# Patient Record
Sex: Male | Born: 1935 | Race: White | Hispanic: No | Marital: Married | Smoking: Never smoker
Health system: Southern US, Community
[De-identification: ages and names within clinical notes are randomized; demographics above are authoritative.]

## PROBLEM LIST (undated history)

## (undated) DIAGNOSIS — J849 Interstitial pulmonary disease, unspecified: Secondary | ICD-10-CM

## (undated) DIAGNOSIS — R609 Edema, unspecified: Secondary | ICD-10-CM

## (undated) DIAGNOSIS — I6529 Occlusion and stenosis of unspecified carotid artery: Secondary | ICD-10-CM

## (undated) DIAGNOSIS — M19171 Post-traumatic osteoarthritis, right ankle and foot: Secondary | ICD-10-CM

## (undated) DIAGNOSIS — J84112 Idiopathic pulmonary fibrosis: Secondary | ICD-10-CM

## (undated) DIAGNOSIS — I10 Essential (primary) hypertension: Secondary | ICD-10-CM

## (undated) DIAGNOSIS — K219 Gastro-esophageal reflux disease without esophagitis: Secondary | ICD-10-CM

## (undated) DIAGNOSIS — H409 Unspecified glaucoma: Secondary | ICD-10-CM

## (undated) DIAGNOSIS — E785 Hyperlipidemia, unspecified: Secondary | ICD-10-CM

## (undated) DIAGNOSIS — R590 Localized enlarged lymph nodes: Secondary | ICD-10-CM

## (undated) DIAGNOSIS — J9610 Chronic respiratory failure, unspecified whether with hypoxia or hypercapnia: Secondary | ICD-10-CM

## (undated) HISTORY — PX: TONSILLECTOMY: SUR1361

## (undated) HISTORY — DX: Unspecified glaucoma: H40.9

## (undated) HISTORY — DX: Hyperlipidemia, unspecified: E78.5

## (undated) HISTORY — DX: Occlusion and stenosis of unspecified carotid artery: I65.29

## (undated) HISTORY — DX: Localized enlarged lymph nodes: R59.0

## (undated) HISTORY — DX: Essential (primary) hypertension: I10

## (undated) HISTORY — PX: OTHER SURGICAL HISTORY: SHX169

## (undated) HISTORY — PX: CHOLECYSTECTOMY: SHX55

## (undated) HISTORY — DX: Gastro-esophageal reflux disease without esophagitis: K21.9

## (undated) HISTORY — DX: Interstitial pulmonary disease, unspecified: J84.9

## (undated) HISTORY — DX: Edema, unspecified: R60.9

## (undated) HISTORY — DX: Post-traumatic osteoarthritis, right ankle and foot: M19.171

## (undated) HISTORY — PX: CAROTID ENDARTERECTOMY: SUR193

## (undated) HISTORY — DX: Chronic respiratory failure, unspecified whether with hypoxia or hypercapnia: J96.10

## (undated) HISTORY — DX: Idiopathic pulmonary fibrosis: J84.112

---

## 2011-05-29 DIAGNOSIS — H4011X0 Primary open-angle glaucoma, stage unspecified: Secondary | ICD-10-CM | POA: Diagnosis not present

## 2011-07-05 DIAGNOSIS — I10 Essential (primary) hypertension: Secondary | ICD-10-CM | POA: Diagnosis not present

## 2011-08-29 DIAGNOSIS — D500 Iron deficiency anemia secondary to blood loss (chronic): Secondary | ICD-10-CM | POA: Diagnosis not present

## 2011-09-10 DIAGNOSIS — Z79899 Other long term (current) drug therapy: Secondary | ICD-10-CM | POA: Diagnosis not present

## 2011-09-10 DIAGNOSIS — Z125 Encounter for screening for malignant neoplasm of prostate: Secondary | ICD-10-CM | POA: Diagnosis not present

## 2011-09-10 DIAGNOSIS — M25519 Pain in unspecified shoulder: Secondary | ICD-10-CM | POA: Diagnosis not present

## 2011-09-10 DIAGNOSIS — I440 Atrioventricular block, first degree: Secondary | ICD-10-CM | POA: Diagnosis not present

## 2011-09-10 DIAGNOSIS — E559 Vitamin D deficiency, unspecified: Secondary | ICD-10-CM | POA: Diagnosis not present

## 2011-09-10 DIAGNOSIS — E782 Mixed hyperlipidemia: Secondary | ICD-10-CM | POA: Diagnosis not present

## 2011-09-18 DIAGNOSIS — M19171 Post-traumatic osteoarthritis, right ankle and foot: Secondary | ICD-10-CM

## 2011-09-18 DIAGNOSIS — M25579 Pain in unspecified ankle and joints of unspecified foot: Secondary | ICD-10-CM | POA: Diagnosis not present

## 2011-09-18 HISTORY — DX: Post-traumatic osteoarthritis, right ankle and foot: M19.171

## 2011-09-26 DIAGNOSIS — H4011X0 Primary open-angle glaucoma, stage unspecified: Secondary | ICD-10-CM | POA: Diagnosis not present

## 2011-10-03 DIAGNOSIS — I440 Atrioventricular block, first degree: Secondary | ICD-10-CM | POA: Diagnosis not present

## 2011-10-08 DIAGNOSIS — M810 Age-related osteoporosis without current pathological fracture: Secondary | ICD-10-CM | POA: Diagnosis not present

## 2011-10-18 DIAGNOSIS — M25519 Pain in unspecified shoulder: Secondary | ICD-10-CM | POA: Diagnosis not present

## 2011-11-21 DIAGNOSIS — M25519 Pain in unspecified shoulder: Secondary | ICD-10-CM | POA: Diagnosis not present

## 2011-11-21 DIAGNOSIS — M719 Bursopathy, unspecified: Secondary | ICD-10-CM | POA: Diagnosis not present

## 2011-11-23 DIAGNOSIS — K2270 Barrett's esophagus without dysplasia: Secondary | ICD-10-CM | POA: Diagnosis not present

## 2011-11-23 DIAGNOSIS — D500 Iron deficiency anemia secondary to blood loss (chronic): Secondary | ICD-10-CM | POA: Diagnosis not present

## 2011-12-04 DIAGNOSIS — H6120 Impacted cerumen, unspecified ear: Secondary | ICD-10-CM | POA: Diagnosis not present

## 2011-12-10 DIAGNOSIS — Z79899 Other long term (current) drug therapy: Secondary | ICD-10-CM | POA: Diagnosis not present

## 2011-12-10 DIAGNOSIS — D539 Nutritional anemia, unspecified: Secondary | ICD-10-CM | POA: Diagnosis not present

## 2011-12-10 DIAGNOSIS — R634 Abnormal weight loss: Secondary | ICD-10-CM | POA: Diagnosis not present

## 2011-12-10 DIAGNOSIS — E559 Vitamin D deficiency, unspecified: Secondary | ICD-10-CM | POA: Diagnosis not present

## 2011-12-19 DIAGNOSIS — R634 Abnormal weight loss: Secondary | ICD-10-CM | POA: Diagnosis not present

## 2012-01-31 DIAGNOSIS — Z23 Encounter for immunization: Secondary | ICD-10-CM | POA: Diagnosis not present

## 2012-02-04 DIAGNOSIS — H4011X0 Primary open-angle glaucoma, stage unspecified: Secondary | ICD-10-CM | POA: Diagnosis not present

## 2012-06-09 DIAGNOSIS — H4011X0 Primary open-angle glaucoma, stage unspecified: Secondary | ICD-10-CM | POA: Diagnosis not present

## 2012-06-21 DIAGNOSIS — I10 Essential (primary) hypertension: Secondary | ICD-10-CM | POA: Diagnosis not present

## 2012-06-27 DIAGNOSIS — I10 Essential (primary) hypertension: Secondary | ICD-10-CM | POA: Diagnosis not present

## 2012-07-01 DIAGNOSIS — Z471 Aftercare following joint replacement surgery: Secondary | ICD-10-CM | POA: Diagnosis not present

## 2012-07-01 DIAGNOSIS — M12579 Traumatic arthropathy, unspecified ankle and foot: Secondary | ICD-10-CM | POA: Diagnosis not present

## 2012-07-01 DIAGNOSIS — Z96669 Presence of unspecified artificial ankle joint: Secondary | ICD-10-CM | POA: Diagnosis not present

## 2012-07-15 DIAGNOSIS — E559 Vitamin D deficiency, unspecified: Secondary | ICD-10-CM | POA: Diagnosis not present

## 2012-07-15 DIAGNOSIS — E782 Mixed hyperlipidemia: Secondary | ICD-10-CM | POA: Diagnosis not present

## 2012-09-10 DIAGNOSIS — Z0000 Encounter for general adult medical examination without abnormal findings: Secondary | ICD-10-CM | POA: Diagnosis not present

## 2012-09-10 DIAGNOSIS — E782 Mixed hyperlipidemia: Secondary | ICD-10-CM | POA: Diagnosis not present

## 2012-09-10 DIAGNOSIS — Z79899 Other long term (current) drug therapy: Secondary | ICD-10-CM | POA: Diagnosis not present

## 2012-09-10 DIAGNOSIS — Z125 Encounter for screening for malignant neoplasm of prostate: Secondary | ICD-10-CM | POA: Diagnosis not present

## 2012-09-10 DIAGNOSIS — I440 Atrioventricular block, first degree: Secondary | ICD-10-CM | POA: Diagnosis not present

## 2012-09-10 DIAGNOSIS — I10 Essential (primary) hypertension: Secondary | ICD-10-CM | POA: Diagnosis not present

## 2012-09-10 DIAGNOSIS — M899 Disorder of bone, unspecified: Secondary | ICD-10-CM | POA: Diagnosis not present

## 2012-09-16 DIAGNOSIS — I440 Atrioventricular block, first degree: Secondary | ICD-10-CM | POA: Diagnosis not present

## 2012-09-17 DIAGNOSIS — Z1211 Encounter for screening for malignant neoplasm of colon: Secondary | ICD-10-CM | POA: Diagnosis not present

## 2012-10-20 DIAGNOSIS — Z961 Presence of intraocular lens: Secondary | ICD-10-CM | POA: Diagnosis not present

## 2012-10-20 DIAGNOSIS — Z9849 Cataract extraction status, unspecified eye: Secondary | ICD-10-CM | POA: Diagnosis not present

## 2012-10-20 DIAGNOSIS — H5200 Hypermetropia, unspecified eye: Secondary | ICD-10-CM | POA: Diagnosis not present

## 2012-10-20 DIAGNOSIS — H4011X0 Primary open-angle glaucoma, stage unspecified: Secondary | ICD-10-CM | POA: Diagnosis not present

## 2012-11-11 DIAGNOSIS — Z86010 Personal history of colonic polyps: Secondary | ICD-10-CM | POA: Diagnosis not present

## 2012-11-11 DIAGNOSIS — K2270 Barrett's esophagus without dysplasia: Secondary | ICD-10-CM | POA: Diagnosis not present

## 2012-11-12 DIAGNOSIS — H40159 Residual stage of open-angle glaucoma, unspecified eye: Secondary | ICD-10-CM | POA: Diagnosis not present

## 2012-12-09 DIAGNOSIS — K2270 Barrett's esophagus without dysplasia: Secondary | ICD-10-CM | POA: Diagnosis not present

## 2012-12-09 DIAGNOSIS — K449 Diaphragmatic hernia without obstruction or gangrene: Secondary | ICD-10-CM | POA: Diagnosis not present

## 2012-12-09 DIAGNOSIS — K2940 Chronic atrophic gastritis without bleeding: Secondary | ICD-10-CM | POA: Diagnosis not present

## 2012-12-09 DIAGNOSIS — K5730 Diverticulosis of large intestine without perforation or abscess without bleeding: Secondary | ICD-10-CM | POA: Diagnosis not present

## 2012-12-09 DIAGNOSIS — D126 Benign neoplasm of colon, unspecified: Secondary | ICD-10-CM | POA: Diagnosis not present

## 2012-12-09 DIAGNOSIS — Z86010 Personal history of colonic polyps: Secondary | ICD-10-CM | POA: Diagnosis not present

## 2013-01-30 DIAGNOSIS — Z23 Encounter for immunization: Secondary | ICD-10-CM | POA: Diagnosis not present

## 2013-02-02 DIAGNOSIS — H40159 Residual stage of open-angle glaucoma, unspecified eye: Secondary | ICD-10-CM | POA: Diagnosis not present

## 2013-03-09 DIAGNOSIS — D649 Anemia, unspecified: Secondary | ICD-10-CM | POA: Diagnosis not present

## 2013-03-30 DIAGNOSIS — J370 Chronic laryngitis: Secondary | ICD-10-CM | POA: Diagnosis not present

## 2013-03-30 DIAGNOSIS — R498 Other voice and resonance disorders: Secondary | ICD-10-CM | POA: Diagnosis not present

## 2013-06-18 DIAGNOSIS — I10 Essential (primary) hypertension: Secondary | ICD-10-CM | POA: Diagnosis not present

## 2013-06-29 DIAGNOSIS — I10 Essential (primary) hypertension: Secondary | ICD-10-CM | POA: Diagnosis not present

## 2013-06-29 DIAGNOSIS — D509 Iron deficiency anemia, unspecified: Secondary | ICD-10-CM | POA: Diagnosis not present

## 2013-07-30 DIAGNOSIS — M25579 Pain in unspecified ankle and joints of unspecified foot: Secondary | ICD-10-CM | POA: Diagnosis not present

## 2013-07-30 DIAGNOSIS — M12579 Traumatic arthropathy, unspecified ankle and foot: Secondary | ICD-10-CM | POA: Diagnosis not present

## 2013-08-28 DIAGNOSIS — H40159 Residual stage of open-angle glaucoma, unspecified eye: Secondary | ICD-10-CM | POA: Diagnosis not present

## 2013-09-11 DIAGNOSIS — E782 Mixed hyperlipidemia: Secondary | ICD-10-CM | POA: Diagnosis not present

## 2013-09-11 DIAGNOSIS — D509 Iron deficiency anemia, unspecified: Secondary | ICD-10-CM | POA: Diagnosis not present

## 2013-09-11 DIAGNOSIS — Z79899 Other long term (current) drug therapy: Secondary | ICD-10-CM | POA: Diagnosis not present

## 2013-09-11 DIAGNOSIS — Z1211 Encounter for screening for malignant neoplasm of colon: Secondary | ICD-10-CM | POA: Diagnosis not present

## 2013-09-11 DIAGNOSIS — Z0000 Encounter for general adult medical examination without abnormal findings: Secondary | ICD-10-CM | POA: Diagnosis not present

## 2013-09-11 DIAGNOSIS — I440 Atrioventricular block, first degree: Secondary | ICD-10-CM | POA: Diagnosis not present

## 2013-09-11 DIAGNOSIS — Z125 Encounter for screening for malignant neoplasm of prostate: Secondary | ICD-10-CM | POA: Diagnosis not present

## 2013-09-11 DIAGNOSIS — I10 Essential (primary) hypertension: Secondary | ICD-10-CM | POA: Diagnosis not present

## 2013-09-15 DIAGNOSIS — Z1211 Encounter for screening for malignant neoplasm of colon: Secondary | ICD-10-CM | POA: Diagnosis not present

## 2013-09-22 DIAGNOSIS — H40159 Residual stage of open-angle glaucoma, unspecified eye: Secondary | ICD-10-CM | POA: Diagnosis not present

## 2013-10-01 DIAGNOSIS — M810 Age-related osteoporosis without current pathological fracture: Secondary | ICD-10-CM | POA: Diagnosis not present

## 2013-11-12 DIAGNOSIS — L84 Corns and callosities: Secondary | ICD-10-CM | POA: Diagnosis not present

## 2013-11-12 DIAGNOSIS — M7750 Other enthesopathy of unspecified foot: Secondary | ICD-10-CM | POA: Diagnosis not present

## 2013-12-23 DIAGNOSIS — K2270 Barrett's esophagus without dysplasia: Secondary | ICD-10-CM | POA: Diagnosis not present

## 2013-12-25 DIAGNOSIS — H40159 Residual stage of open-angle glaucoma, unspecified eye: Secondary | ICD-10-CM | POA: Diagnosis not present

## 2014-02-09 DIAGNOSIS — Z23 Encounter for immunization: Secondary | ICD-10-CM | POA: Diagnosis not present

## 2014-02-24 DIAGNOSIS — Z23 Encounter for immunization: Secondary | ICD-10-CM | POA: Diagnosis not present

## 2014-04-26 DIAGNOSIS — H40153 Residual stage of open-angle glaucoma, bilateral: Secondary | ICD-10-CM | POA: Diagnosis not present

## 2014-06-07 DIAGNOSIS — H40153 Residual stage of open-angle glaucoma, bilateral: Secondary | ICD-10-CM | POA: Diagnosis not present

## 2014-07-16 DIAGNOSIS — H40153 Residual stage of open-angle glaucoma, bilateral: Secondary | ICD-10-CM | POA: Diagnosis not present

## 2014-08-03 DIAGNOSIS — M19171 Post-traumatic osteoarthritis, right ankle and foot: Secondary | ICD-10-CM | POA: Diagnosis not present

## 2014-08-03 DIAGNOSIS — Z96661 Presence of right artificial ankle joint: Secondary | ICD-10-CM | POA: Diagnosis not present

## 2014-08-03 DIAGNOSIS — M25571 Pain in right ankle and joints of right foot: Secondary | ICD-10-CM | POA: Diagnosis not present

## 2014-09-03 DIAGNOSIS — E611 Iron deficiency: Secondary | ICD-10-CM | POA: Diagnosis not present

## 2014-09-03 DIAGNOSIS — D509 Iron deficiency anemia, unspecified: Secondary | ICD-10-CM | POA: Diagnosis not present

## 2014-09-03 DIAGNOSIS — D649 Anemia, unspecified: Secondary | ICD-10-CM | POA: Diagnosis not present

## 2014-09-06 DIAGNOSIS — H40153 Residual stage of open-angle glaucoma, bilateral: Secondary | ICD-10-CM | POA: Diagnosis not present

## 2014-09-20 DIAGNOSIS — Z760 Encounter for issue of repeat prescription: Secondary | ICD-10-CM | POA: Diagnosis not present

## 2014-09-20 DIAGNOSIS — Z0000 Encounter for general adult medical examination without abnormal findings: Secondary | ICD-10-CM | POA: Diagnosis not present

## 2014-09-20 DIAGNOSIS — E782 Mixed hyperlipidemia: Secondary | ICD-10-CM | POA: Diagnosis not present

## 2014-09-20 DIAGNOSIS — Z79899 Other long term (current) drug therapy: Secondary | ICD-10-CM | POA: Diagnosis not present

## 2014-09-20 DIAGNOSIS — M8580 Other specified disorders of bone density and structure, unspecified site: Secondary | ICD-10-CM | POA: Diagnosis not present

## 2014-10-18 DIAGNOSIS — H40153 Residual stage of open-angle glaucoma, bilateral: Secondary | ICD-10-CM | POA: Diagnosis not present

## 2015-01-19 DIAGNOSIS — K2270 Barrett's esophagus without dysplasia: Secondary | ICD-10-CM | POA: Diagnosis not present

## 2015-01-27 DIAGNOSIS — Z23 Encounter for immunization: Secondary | ICD-10-CM | POA: Diagnosis not present

## 2015-02-10 DIAGNOSIS — L918 Other hypertrophic disorders of the skin: Secondary | ICD-10-CM | POA: Diagnosis not present

## 2015-02-10 DIAGNOSIS — L578 Other skin changes due to chronic exposure to nonionizing radiation: Secondary | ICD-10-CM | POA: Diagnosis not present

## 2015-02-10 DIAGNOSIS — C44329 Squamous cell carcinoma of skin of other parts of face: Secondary | ICD-10-CM | POA: Diagnosis not present

## 2015-02-10 DIAGNOSIS — L570 Actinic keratosis: Secondary | ICD-10-CM | POA: Diagnosis not present

## 2015-02-18 DIAGNOSIS — H40153 Residual stage of open-angle glaucoma, bilateral: Secondary | ICD-10-CM | POA: Diagnosis not present

## 2015-04-22 DIAGNOSIS — H401133 Primary open-angle glaucoma, bilateral, severe stage: Secondary | ICD-10-CM | POA: Diagnosis not present

## 2015-05-18 DIAGNOSIS — J208 Acute bronchitis due to other specified organisms: Secondary | ICD-10-CM | POA: Diagnosis not present

## 2015-08-15 DIAGNOSIS — R634 Abnormal weight loss: Secondary | ICD-10-CM | POA: Diagnosis not present

## 2015-08-15 DIAGNOSIS — R05 Cough: Secondary | ICD-10-CM | POA: Diagnosis not present

## 2015-08-19 DIAGNOSIS — R634 Abnormal weight loss: Secondary | ICD-10-CM | POA: Diagnosis not present

## 2015-08-19 DIAGNOSIS — R590 Localized enlarged lymph nodes: Secondary | ICD-10-CM | POA: Diagnosis not present

## 2015-08-19 DIAGNOSIS — J849 Interstitial pulmonary disease, unspecified: Secondary | ICD-10-CM

## 2015-08-19 DIAGNOSIS — J8410 Pulmonary fibrosis, unspecified: Secondary | ICD-10-CM | POA: Diagnosis not present

## 2015-08-19 HISTORY — DX: Interstitial pulmonary disease, unspecified: J84.9

## 2015-08-22 DIAGNOSIS — H401133 Primary open-angle glaucoma, bilateral, severe stage: Secondary | ICD-10-CM | POA: Diagnosis not present

## 2015-08-24 DIAGNOSIS — H47233 Glaucomatous optic atrophy, bilateral: Secondary | ICD-10-CM | POA: Diagnosis not present

## 2015-08-24 DIAGNOSIS — H52223 Regular astigmatism, bilateral: Secondary | ICD-10-CM | POA: Diagnosis not present

## 2015-08-24 DIAGNOSIS — H524 Presbyopia: Secondary | ICD-10-CM | POA: Diagnosis not present

## 2015-08-24 DIAGNOSIS — H5203 Hypermetropia, bilateral: Secondary | ICD-10-CM | POA: Diagnosis not present

## 2015-08-24 DIAGNOSIS — Z961 Presence of intraocular lens: Secondary | ICD-10-CM | POA: Diagnosis not present

## 2015-08-24 DIAGNOSIS — H401133 Primary open-angle glaucoma, bilateral, severe stage: Secondary | ICD-10-CM | POA: Diagnosis not present

## 2015-08-24 DIAGNOSIS — I10 Essential (primary) hypertension: Secondary | ICD-10-CM | POA: Diagnosis not present

## 2015-08-24 DIAGNOSIS — H353131 Nonexudative age-related macular degeneration, bilateral, early dry stage: Secondary | ICD-10-CM | POA: Diagnosis not present

## 2015-08-24 DIAGNOSIS — H3530 Unspecified macular degeneration: Secondary | ICD-10-CM | POA: Diagnosis not present

## 2015-09-22 ENCOUNTER — Ambulatory Visit (INDEPENDENT_AMBULATORY_CARE_PROVIDER_SITE_OTHER): Payer: Medicare Other | Admitting: Pulmonary Disease

## 2015-09-22 ENCOUNTER — Encounter: Payer: Self-pay | Admitting: Pulmonary Disease

## 2015-09-22 ENCOUNTER — Other Ambulatory Visit (INDEPENDENT_AMBULATORY_CARE_PROVIDER_SITE_OTHER): Payer: Medicare Other

## 2015-09-22 VITALS — BP 160/86 | HR 57 | Ht 69.0 in | Wt 156.6 lb

## 2015-09-22 DIAGNOSIS — K219 Gastro-esophageal reflux disease without esophagitis: Secondary | ICD-10-CM | POA: Diagnosis not present

## 2015-09-22 DIAGNOSIS — R600 Localized edema: Secondary | ICD-10-CM | POA: Diagnosis not present

## 2015-09-22 DIAGNOSIS — I10 Essential (primary) hypertension: Secondary | ICD-10-CM

## 2015-09-22 DIAGNOSIS — R609 Edema, unspecified: Secondary | ICD-10-CM

## 2015-09-22 DIAGNOSIS — R599 Enlarged lymph nodes, unspecified: Secondary | ICD-10-CM | POA: Diagnosis not present

## 2015-09-22 DIAGNOSIS — R590 Localized enlarged lymph nodes: Secondary | ICD-10-CM

## 2015-09-22 DIAGNOSIS — H409 Unspecified glaucoma: Secondary | ICD-10-CM | POA: Insufficient documentation

## 2015-09-22 DIAGNOSIS — R0602 Shortness of breath: Secondary | ICD-10-CM | POA: Diagnosis not present

## 2015-09-22 DIAGNOSIS — J849 Interstitial pulmonary disease, unspecified: Secondary | ICD-10-CM | POA: Diagnosis not present

## 2015-09-22 DIAGNOSIS — E785 Hyperlipidemia, unspecified: Secondary | ICD-10-CM | POA: Insufficient documentation

## 2015-09-22 HISTORY — DX: Essential (primary) hypertension: I10

## 2015-09-22 HISTORY — DX: Edema, unspecified: R60.9

## 2015-09-22 HISTORY — DX: Localized enlarged lymph nodes: R59.0

## 2015-09-22 LAB — C-REACTIVE PROTEIN: CRP: 0.5 mg/dL (ref 0.5–20.0)

## 2015-09-22 LAB — SEDIMENTATION RATE: Sed Rate: 16 mm/hr (ref 0–22)

## 2015-09-22 LAB — BRAIN NATRIURETIC PEPTIDE: PRO B NATRI PEPTIDE: 97 pg/mL (ref 0.0–100.0)

## 2015-09-22 NOTE — Progress Notes (Signed)
Subjective:    Patient ID: Casey [name], male    DOB: Jun 02, 1928, 82 y.o.   MRN: [id number]  HPI He reports his entire family rented a house in [facility] the week after Christmas 2016. He reports that during that trip 6 separate individuals developed a cough and "cold". He reports that after he got home he had a week of coughing. His daughter-in-law reports she had sinus congestion & drainage prior to developing a cough productive of a green mucus. She denies any fever, chills, or sweats at that time. She did have a sore throat at the time. She was seen by a physician and was treated with antibiotics without a steroid & improvement in symptoms. The house was dry without any musty smell. There was a pool that was heated but still cool. The pool water appeared clean. He reports he was back home for approximately 3 days before he developed a cough that was nonproductive as well as sinus congestion. He denies any associated fever, chills, or sweats. He reports he didn't have any antibiotic therapy. The cough continued to linger and prompted his evaluation. He reports he had lost about 7-9 pounds in 2-3 months. He denies any associated chest tightness or pressure. Denies any dyspnea. Denies any rashes or abnormal bruising. Denies any prior exposure to chemotherapy agents, radiation, or antiarrhythmics. He reports his cough has mostly resolved.    Review of Systems No joint swelling, erythema, or stiffness. No dry eyes or dry mouth. No reflux, dyspepsia, morning brash water taste, dysphagia or odynophagia. A pertinent 14 point review of systems is negative except as per the history of presenting illness.  No Known Allergies  No current outpatient prescriptions on file prior to visit.   No current facility-administered medications on file prior to visit.    Past Medical History  Diagnosis Date  . Hypertension   . Hyperlipemia   . GERD (gastroesophageal reflux disease)   . Glaucoma   . Carotid artery  stenosis     Past Surgical History  Procedure Laterality Date  . Tonsillectomy    . Hernia surgery    . Right ankle replacement    . Right leg surgery      x 2  . Cholecystectomy    . Carotid endarterectomy      Family History  Problem Relation Age of Onset  . Heart disease Father   . Heart disease Brother   . Diabetes Brother   . Prostate cancer Brother   . Heart disease Sister   . Diabetes Sister   . Lung disease Neg Hx   . Rheumatologic disease Neg Hx   . Heart disease Other     Social History   Social History  . Marital Status: Married    Spouse Name: N/A  . Number of Children: N/A  . Years of Education: N/A   Occupational History  . Retired     Customer service manager   Social History Main Topics  . Smoking status: Never Smoker   . Smokeless tobacco: Never Used  . Alcohol Use: 0.0 oz/week    0 Standard drinks or equivalent per week     Comment: social  . Drug Use: No  . Sexual Activity: Not Asked   Other Topics Concern  . None   Social History Narrative   Originally from [place]. Has lived in [place], [place], [place], [place]. Remote travel to [place], [place], & [place]. Previously worked Education administrator. Currently has a dog. No bird, mold, asbestos,  or hot tub exposure. Patient does have a pool. Enjoys doing yard work. Remote exposure to silage and hay. Also worked with cattle.       Objective:   Physical Exam BP 160/86 mmHg  Pulse 57  Ht 5' 9"  (1.753 m)  Wt 156 lb 9.6 oz (71.033 kg)  BMI 23.12 kg/m2  SpO2 95% General:  Awake. Alert. No acute distress. Thin, elderly male.  Integument:  Warm & dry. No rash on exposed skin.  Lymphatics:  No appreciated cervical or supraclavicular lymphadenoapthy. HEENT:  Moist mucus membranes. No oral ulcers. No scleral injection or icterus. Bilateral nasal turbinate swelling right greater than left. Cardiovascular:  Regular rate. Bilateral lower extremity edema right greater than left. No appreciable JVD.  Pulmonary:  Good aeration  bilaterally. Bilateral diffuse crackles on auscultation. Symmetric chest wall expansion. No accessory muscle use on room air. Abdomen: Soft. Normal bowel sounds. Nondistended. Grossly nontender. Musculoskeletal:  Normal bulk and tone. Hand grip strength 5/5 bilaterally. No joint deformity or effusion appreciated. Neurological:  CN 2-12 grossly in tact. No meningismus. Moving all 4 extremities equally. Symmetric brachioradialis deep tendon reflexes. Psychiatric:  Mood and affect congruent. Speech normal rhythm, rate & tone.   IMAGING CT CHEST/ABD/PELVIS W/ 08/19/15 (personally reviewed by me): Mediastinal lymphadenopathy noted with the largest lymph node measuring 1.6 cm at level 7, 1.3 cm 4L, & 1.1 cm 4R positions. Calcifications noted within the spleen and liver suggestive of prior granulomatous disease. No pleural effusion or thickening. Patchy bilateral intralobular septal thickening and reticulation consistent with interstitial lung disease. This appears both centrally in the lungs as well as peripherally occurring throughout all fields with slightly less involvement in the upper lung zones. Suggestion of traction bronchiectasis. No obvious groundglass opacities. There is also suggestion of honeycomb changes. Remote T-8 compression fracture with advanced anterior height loss. Mild finding enlargement of left inguinal canal. Renal cysts on the left noted with mild renal atrophy and no hydronephrosis. Enlarged central prostate that is symmetric. Colonic diverticulosis noted. No appendicitis. No mass or adenopathy. No ascites or pneumoperitoneum.  PORT CXR 02/01/09 (personally reviewed by me): Heart appears to be largely normal in size and unchanged when compared with previous portable chest x-ray from 2009. Mediastinum normal in contour. No obvious parenchymal opacities or increased interstitial markings. No pleural effusion.  LABS 08/15/15 CBC: 9.1/14.2/43.2/239 BMP: 1:30/3.9/105/24/20/1.43/98/9.0 LFT:  3.8/6.7/0.4/73/19/20 ESR: 11 CRP: 5.5    Assessment & Plan:  82 year old male with an acute illness after travel to [place] vacation home in December 2016. Given the timing of patient's symptoms and other family members were also sick I am highly suspicious that he had an exposure possibly to a mild/fungus that could have precipitated acute hypersensitivity pneumonitis and what I am seeing on his chest CT scan at this time is simply the fibrotic changes from this acute illness as his symptoms have largely resolved at this time. Certainly these fibrotic changes could've been present for some time previously and I did communicate this to both the patient and his daughter-in-law who was present; however, there is no chest imaging that would provide a correlate for duration on my review. He lacks any family history of autoimmune disease or personal symptoms that would suggest an autoimmune process as a potential cause for the interstitial lung disease seen. We did discuss the mediastinal lymphadenopathy with pathologically enlarged right, but he has no parenchymal opacities or nodules that would suggest lung cancer. Certainly lymphoma is a possibility, but he only reports  weight loss and no other symptoms that would suggest an underlying malignancy. We will need to better address his lung function and further trend his lung function with time. Given the absence of symptoms and obvious inflammation on CT imaging I do not feel that immunosuppression is warranted or necessary at this time. I instructed the patient to contact my office if he had any new breathing problems or questions before his next appointment.  1. ILD: Checking high-resolution CT chest without contrast. Checking full pulmonary function testing as well as 6 minute walk test on room air at follow-up appointment. Ordering ESR, CRP, ANA with reflex to comprehensive panel, & hypersensitivity pneumonitis panel. Deferring surgical lung biopsy &  immunosuppression at this time. 2. Mediastinal lymphadenopathy: Plan to repeat CT chest without contrast after next appointment. 3. GERD: Asymptomatic on Protonix. No changes at this time. 4. Edema: Checking serum BNP. Consider transthoracic echocardiogram based on this result. 5. Follow-up: Patient to return to clinic in 3-4 weeks.  Sonia Baller Ashok Cordia, M.D. [facility] Pulmonary & Critical Care Pager:  [phone] After 3pm or if no response, call [phone] 12:34 PM 09/22/2015

## 2015-09-22 NOTE — Patient Instructions (Signed)
   Call me if you have any new breathing problems or return of your cough  We will review your blood work and chest CT scan at your next appointment  You'll have a breathing and walking test at your next appointment  I will see you back in 3-4 weeks  TESTS ORDERED: 1. Lab work today 2. High-resolution CT chest without contrast at [facility] 3. Full pulmonary function testing at follow-up appointment 4. 6 minute walk test on room air at follow-up appointment

## 2015-09-26 LAB — HYPERSENSITIVITY PNEUMONITIS
A. FUMIGATUS #1 ABS: NEGATIVE
A. Pullulans Abs: NEGATIVE
MICROPOLYSPORA FAENI IGG: NEGATIVE
PIGEON SERUM ABS: NEGATIVE
THERMOACTINOMYCES VULGARIS IGG: NEGATIVE
Thermoact. Saccharii: NEGATIVE

## 2015-09-26 LAB — ANA W/REFLEX IF POSITIVE: Anti Nuclear Antibody(ANA): NEGATIVE

## 2015-09-27 DIAGNOSIS — J849 Interstitial pulmonary disease, unspecified: Secondary | ICD-10-CM | POA: Diagnosis not present

## 2015-09-27 DIAGNOSIS — R590 Localized enlarged lymph nodes: Secondary | ICD-10-CM | POA: Diagnosis not present

## 2015-09-27 DIAGNOSIS — J984 Other disorders of lung: Secondary | ICD-10-CM | POA: Diagnosis not present

## 2015-10-13 ENCOUNTER — Encounter: Payer: Self-pay | Admitting: Pulmonary Disease

## 2015-10-20 ENCOUNTER — Ambulatory Visit: Payer: Medicare Other | Admitting: Pulmonary Disease

## 2015-10-21 ENCOUNTER — Encounter: Payer: Self-pay | Admitting: Pulmonary Disease

## 2015-10-21 ENCOUNTER — Other Ambulatory Visit: Payer: Medicare Other

## 2015-10-21 ENCOUNTER — Ambulatory Visit (INDEPENDENT_AMBULATORY_CARE_PROVIDER_SITE_OTHER): Payer: Medicare Other | Admitting: Pulmonary Disease

## 2015-10-21 ENCOUNTER — Telehealth: Payer: Self-pay | Admitting: Pulmonary Disease

## 2015-10-21 VITALS — BP 126/70 | HR 74 | Ht 68.0 in | Wt 154.0 lb

## 2015-10-21 DIAGNOSIS — K219 Gastro-esophageal reflux disease without esophagitis: Secondary | ICD-10-CM | POA: Diagnosis not present

## 2015-10-21 DIAGNOSIS — J849 Interstitial pulmonary disease, unspecified: Secondary | ICD-10-CM

## 2015-10-21 DIAGNOSIS — R590 Localized enlarged lymph nodes: Secondary | ICD-10-CM

## 2015-10-21 DIAGNOSIS — R599 Enlarged lymph nodes, unspecified: Secondary | ICD-10-CM

## 2015-10-21 NOTE — Progress Notes (Signed)
Test noted.

## 2015-10-21 NOTE — Telephone Encounter (Signed)
LABS 09/22/15 ProBNP:  97.0 CRP:  0.5 ESR:  16 ANA:  Negative Hypersensitivity Pneumonitis Panel:  Negative

## 2015-10-21 NOTE — Progress Notes (Signed)
Subjective:    Patient ID: Casey [name], male    DOB: 03-29-1929, 82 y.o.   MRN: [id number]  C.C.:  Follow-up for ILD, Mediastinal Adenopathy, & GERD.  HPI ILD:  Continues to have intermittent coughing. No wheezing. No dyspnea. Autoimmune workup has been negative thus far. 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   Mediastinal Adenopathy:  Noted on CT imaging in April 2017. No fever, chills, or sweats. No weight changes.  GERD:  On Protonix. No reflux or dyspepsia. No morning brash water taste.   Review of Systems No chest pain or pressure. No rashes but does bruise easily. No new joint pain or swelling.   No Known Allergies  Current Outpatient Prescriptions on File Prior to Visit  Medication Sig Dispense Refill  . aspirin EC 81 MG tablet Take 1 tablet by mouth daily.    Marland Kitchen atorvastatin (LIPITOR) 20 MG tablet Take 1 tablet by mouth daily.    . Cholecalciferol (VITAMIN D3) 2000 units TABS Take by mouth daily.    . cloNIDine (CATAPRES) 0.1 MG tablet Take 1 tablet by mouth at bedtime.    . Coenzyme Q10 (CO Q-10) 100 MG CAPS Take by mouth daily.    . Dorzolamide HCl-Timolol Mal (COSOPT OP) daily.    . Enalapril-Hydrochlorothiazide 5-12.5 MG tablet Take 1 tablet by mouth every morning.    . latanoprost (XALATAN) 0.005 % ophthalmic solution 2 (two) times daily.    Marland Kitchen LUMIGAN 0.01 % SOLN daily.  10  . pantoprazole (PROTONIX) 40 MG tablet Take 1 tablet by mouth daily.     No current facility-administered medications on file prior to visit.    Past Medical History  Diagnosis Date  . Hypertension   . Hyperlipemia   . GERD (gastroesophageal reflux disease)   . Glaucoma   . Carotid artery stenosis     Past Surgical History  Procedure Laterality Date  . Tonsillectomy    . Hernia surgery    . Right ankle replacement    . Right leg surgery      x 2  . Cholecystectomy    . Carotid endarterectomy      Family History  Problem Relation Age of Onset  . Heart disease Father   . Heart disease  Brother   . Diabetes Brother   . Prostate cancer Brother   . Heart disease Sister   . Diabetes Sister   . Lung disease Neg Hx   . Rheumatologic disease Neg Hx   . Heart disease Other     Social History   Social History  . Marital Status: Married    Spouse Name: N/A  . Number of Children: N/A  . Years of Education: N/A   Occupational History  . Retired     Customer service manager   Social History Main Topics  . Smoking status: Never Smoker   . Smokeless tobacco: Never Used  . Alcohol Use: 0.0 oz/week    0 Standard drinks or equivalent per week     Comment: social  . Drug Use: No  . Sexual Activity: Not Asked   Other Topics Concern  . None   Social History Narrative   Originally from [place]. Has lived in [place], [place], [place], [place]. Remote travel to [place], [place], & [place]. Previously worked Education administrator. Currently has a dog. No bird, mold, asbestos, or hot tub exposure. Patient does have a pool. Enjoys doing yard work. Remote exposure to silage and hay. Also worked with cattle.  Objective:   Physical Exam BP 126/70 mmHg  Pulse 74  Ht _0  (1.727 m)  Wt 154 lb (69.854 kg)  BMI 23.42 kg/m2  SpO2 91% General:  Awake. Alert. No distress. Accompanied by wife today.  Integument:  Warm & dry. No rash on exposed skin.  Lymphatics:  No appreciated cervical or supraclavicular lymphadenoapthy. HEENT:  Moist mucus membranes. No oral ulcers. No scleral injection.  Cardiovascular:  Regular rate. Trace edema. Normal S1 & S2.  Pulmonary:  Basilar predominant crackles persist. Normal work of breathing on room air. Speaking in complete sentences.  6MWT 10/21/15:  Walked 324 meters / Baseline Sat 95% on RA / Nadir Sat 88% on RA @ end of test  IMAGING HRCT CHEST W/O 09/27/15 (personally reviewed by me):No pericardial effusion. No pleural effusion or thickening. Traction bronchiectasis with subpleural and primary bronchovascular reticular changes as well as groundglass. Involvement of both  apical and basilar regions. Early honeycomb changes. Small to moderate hiatal hernia. Calcifications noted within the liver and spleen. No axillary adenopathy. Enlarged mediastinal lymph nodes measuring up to 1.8 cm in short axis present.  CT CHEST/ABD/PELVIS W/ 08/19/15 (previously reviewed by me): Mediastinal lymphadenopathy noted with the largest lymph node measuring 1.6 cm at level 7, 1.3 cm 4L, & 1.1 cm 4R positions. Calcifications noted within the spleen and liver suggestive of prior granulomatous disease. No pleural effusion or thickening. Patchy bilateral intralobular septal thickening and reticulation consistent with interstitial lung disease. This appears both centrally in the lungs as well as peripherally occurring throughout all fields with slightly less involvement in the upper lung zones. Suggestion of traction bronchiectasis. No obvious groundglass opacities. There is also suggestion of honeycomb changes. Remote T-8 compression fracture with advanced anterior height loss. Mild finding enlargement of left inguinal canal. Renal cysts on the left noted with mild renal atrophy and no hydronephrosis. Enlarged central prostate that is symmetric. Colonic diverticulosis noted. No appendicitis. No mass or adenopathy. No ascites or pneumoperitoneum.  PORT CXR 02/01/09 (previously reviewed by me): Heart appears to be largely normal in size and unchanged when compared with previous portable chest x-ray from 2009. Mediastinum normal in contour. No obvious parenchymal opacities or increased interstitial markings. No pleural effusion.  LABS 09/22/15 ProBNP: 97.0 CRP: 0.5 ESR: 16 ANA: Negative Hypersensitivity Pneumonitis Panel: Negative   08/15/15 CBC: 9.1/14.2/43.2/239 BMP: 1:30/3.9/105/24/20/1.43/98/9.0 LFT: 3.8/6.7/0.4/73/19/20 ESR: 11 CRP: 5.5    Assessment & Plan:  82 year old male with underlying interstitial lung disease. Patient does have a significant desaturation at the end of the 6  minute walk test but recovered quickly. Therefore, I'm not prescribing him oxygen therapy. We had a lengthy discussion about continuing monitoring with pulmonary function testing versus a surgical lung biopsy. We are continuing to hold on surgical lung biopsy at this time as well as initiating immunosuppressive therapy. In the absence of elevated inflammatory markers I feel the groundglass seen on his CT imaging exactly fibrosis rather than an acute inflammatory process. His reflux seems to be well-controlled at this time and he has minimal symptomatology. I believe his mediastinal lymphadenopathy is reactive and secondary to his parenchymal lung disease process. 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 I instructed the patient to contact my office if he had any new breathing problems or symptoms before his next appointment.  1. ILD:  Obtaining pulmonary function testing from [facility]. Continuing autoimmune workup with serum anti-CCP, rheumatoid factor, SCL 70, SSA, SSB,  anti-chromatin antibody, centromere antibody screen, Smith antibody, & double-stranded DNA antibody. Repeat spirometry with DLCO at next appointment & 6 minute walk test on room air. 2. Mediastinal lymphadenopathy: Seen on imaging April 2017. Plan to repeat CT chest without contrast in 6 months. 3. GERD: Continuing Protonix. Well controlled. 4. Follow-up: Patient to return to clinic in 2-3 months or sooner if needed.  Sonia Baller Ashok Cordia, M.D. [facility] Pulmonary & Critical Care Pager:  [phone] After 3pm or if no response, call [phone] 5:07 PM 10/21/2015

## 2015-10-21 NOTE — Patient Instructions (Addendum)
   Call me if you have any new breathing problems before your next appointment or new coughing.  I will see you back in 2-3 months or sooner if needed.  TESTS ORDERED: 1. Spirometry with DLCO at next appointment 2. 6 minute walk test on room air at next appointment 3. Serum anti-CCP, rheumatoid factor, SCL 70, SSA, SSB, anti-chromatin antibody, centromere antibody screen, Smith antibody, & double-stranded DNA antibody.

## 2015-10-22 LAB — RHEUMATOID FACTOR

## 2015-10-24 LAB — SJOGRENS SYNDROME-B EXTRACTABLE NUCLEAR ANTIBODY: SSB (LA) (ENA) ANTIBODY, IGG: NEGATIVE

## 2015-10-24 LAB — ANTI-SCLERODERMA ANTIBODY: SCLERODERMA (SCL-70) (ENA) ANTIBODY, IGG: NEGATIVE

## 2015-10-24 LAB — CYCLIC CITRUL PEPTIDE ANTIBODY, IGG: Cyclic Citrullin Peptide Ab: <16 Units

## 2015-10-24 LAB — ANTI-SMITH ANTIBODY: ENA SM AB SER-ACNC: NEGATIVE

## 2015-10-24 LAB — SJOGRENS SYNDROME-A EXTRACTABLE NUCLEAR ANTIBODY: SSA (RO) (ENA) ANTIBODY, IGG: NEGATIVE

## 2015-10-24 LAB — CENTROMERE ANTIBODIES: CENTROMERE AB SCREEN: NEGATIVE

## 2015-10-24 LAB — ANTI-DNA ANTIBODY, DOUBLE-STRANDED: ds DNA Ab: 1 IU/mL

## 2015-10-25 LAB — ANTICHROMATIN ANTIBODIES

## 2015-10-28 NOTE — Progress Notes (Signed)
Quick Note:  Spoke with pt and notified of results per Dr. Ashok Cordia. Pt verbalized understanding and denied any questions.  ______

## 2015-12-08 DIAGNOSIS — J8410 Pulmonary fibrosis, unspecified: Secondary | ICD-10-CM | POA: Diagnosis not present

## 2015-12-08 DIAGNOSIS — Z79899 Other long term (current) drug therapy: Secondary | ICD-10-CM | POA: Diagnosis not present

## 2015-12-08 DIAGNOSIS — Z1389 Encounter for screening for other disorder: Secondary | ICD-10-CM | POA: Diagnosis not present

## 2015-12-08 DIAGNOSIS — Z0000 Encounter for general adult medical examination without abnormal findings: Secondary | ICD-10-CM | POA: Diagnosis not present

## 2015-12-08 DIAGNOSIS — Z9181 History of falling: Secondary | ICD-10-CM | POA: Diagnosis not present

## 2015-12-14 DIAGNOSIS — D120 Benign neoplasm of cecum: Secondary | ICD-10-CM | POA: Diagnosis not present

## 2015-12-14 DIAGNOSIS — D125 Benign neoplasm of sigmoid colon: Secondary | ICD-10-CM | POA: Diagnosis not present

## 2015-12-14 DIAGNOSIS — K5730 Diverticulosis of large intestine without perforation or abscess without bleeding: Secondary | ICD-10-CM | POA: Diagnosis not present

## 2015-12-14 DIAGNOSIS — Z1211 Encounter for screening for malignant neoplasm of colon: Secondary | ICD-10-CM | POA: Diagnosis not present

## 2015-12-14 DIAGNOSIS — D122 Benign neoplasm of ascending colon: Secondary | ICD-10-CM | POA: Diagnosis not present

## 2015-12-14 DIAGNOSIS — Z86010 Personal history of colonic polyps: Secondary | ICD-10-CM | POA: Diagnosis not present

## 2015-12-14 DIAGNOSIS — K635 Polyp of colon: Secondary | ICD-10-CM | POA: Diagnosis not present

## 2015-12-15 DIAGNOSIS — D649 Anemia, unspecified: Secondary | ICD-10-CM | POA: Diagnosis not present

## 2015-12-20 ENCOUNTER — Telehealth: Payer: Self-pay | Admitting: Pulmonary Disease

## 2015-12-20 NOTE — Telephone Encounter (Signed)
Try Delsym 2 tsp At bedtime  For cough , see if that works.  Please contact office for sooner follow up if symptoms do not improve or worsen or seek emergency care

## 2015-12-20 NOTE — Telephone Encounter (Signed)
Spoke with pt's wife, states that pt's nonprod, "hacky" cough has been lingering- worse during the day and less problematic qhs.  Pt's wife states this is not related to swallowing, talking or exertion, just happens throughout the day. Cough has been present X7 months.     Denies mucus production, fever, chills, nausea, vomiting, chest pain.    Pt uses [facility].    JN please advise on recs.  Thanks!

## 2015-12-20 NOTE — Telephone Encounter (Signed)
Called spoke with spouse. Aware of recs below. Nothing further needed.

## 2015-12-20 NOTE — Telephone Encounter (Signed)
Please advise TP as Dr Ashok Cordia is not available this afternoon . Thanks.

## 2015-12-26 DIAGNOSIS — H401133 Primary open-angle glaucoma, bilateral, severe stage: Secondary | ICD-10-CM | POA: Diagnosis not present

## 2016-01-18 ENCOUNTER — Ambulatory Visit (INDEPENDENT_AMBULATORY_CARE_PROVIDER_SITE_OTHER): Payer: Medicare Other | Admitting: Pulmonary Disease

## 2016-01-18 ENCOUNTER — Encounter: Payer: Self-pay | Admitting: Pulmonary Disease

## 2016-01-18 VITALS — BP 138/64 | HR 52 | Ht 69.0 in | Wt 155.4 lb

## 2016-01-18 DIAGNOSIS — J849 Interstitial pulmonary disease, unspecified: Secondary | ICD-10-CM

## 2016-01-18 DIAGNOSIS — K219 Gastro-esophageal reflux disease without esophagitis: Secondary | ICD-10-CM | POA: Diagnosis not present

## 2016-01-18 DIAGNOSIS — R599 Enlarged lymph nodes, unspecified: Secondary | ICD-10-CM | POA: Diagnosis not present

## 2016-01-18 DIAGNOSIS — R590 Localized enlarged lymph nodes: Secondary | ICD-10-CM

## 2016-01-18 NOTE — Patient Instructions (Addendum)
   Continue taking your medications as prescribed.  Call me if you feel your cough or breathing are getting worse.  We will repeat your chest CT scan in October to follow the lymph nodes around your windpipe.  You will have a breathing & walking test at your next appointment in 3 months. Call me if you need to be seen sooner than that.  TESTS ORDERED: 1. Spirometry with DLCO at next appointment 2. 6 minute walk test on room air at next appointment 3. CT CHEST w/o October 2017

## 2016-01-18 NOTE — Progress Notes (Signed)
Subjective:    Patient ID: Casey [name], male    DOB: 03/28/1929, 82 y.o.   MRN: [id number]  C.C.:  Follow-up for ILD/NSIP Pattern, Mediastinal Adenopathy, & GERD.  HPI ILD/NSIP Pattern:  He feels like his breathing is "the same". His wife reports he has an intermittent, mild cough throughout the day. Doesn't seem to cough at night. No wheezing. He does continue to have dyspnea on exertion. He does feel his cough is significantly improved compared with previous.   Mediastinal Adenopathy:  Noted on CT imaging in April 2017. Planned for repeat CT imaging in October 2017. No adenopathy in his neck, groin, or axilla. No unexpected weight loss.   GERD:  On Protonix. No reflux, dyspepsia, or morning brash water taste.  Review of Systems No chest pain, tightness, or pressure. No fever, chills, or sweats. No sinus congestion, pressure, or drainage.   No Known Allergies  Current Outpatient Prescriptions on File Prior to Visit  Medication Sig Dispense Refill  . aspirin EC 81 MG tablet Take 1 tablet by mouth daily.    Marland Kitchen atorvastatin (LIPITOR) 20 MG tablet Take 1 tablet by mouth daily.    . Cholecalciferol (VITAMIN D3) 2000 units TABS Take by mouth daily.    . cloNIDine (CATAPRES) 0.1 MG tablet Take 1 tablet by mouth at bedtime.    . Coenzyme Q10 (CO Q-10) 100 MG CAPS Take by mouth daily.    . Dorzolamide HCl-Timolol Mal (COSOPT OP) daily.    . Enalapril-Hydrochlorothiazide 5-12.5 MG tablet Take 1 tablet by mouth every morning.    . latanoprost (XALATAN) 0.005 % ophthalmic solution 2 (two) times daily.    Marland Kitchen LUMIGAN 0.01 % SOLN daily.  10  . pantoprazole (PROTONIX) 40 MG tablet Take 1 tablet by mouth daily.     No current facility-administered medications on file prior to visit.     Past Medical History:  Diagnosis Date  . Carotid artery stenosis   . GERD (gastroesophageal reflux disease)   . Glaucoma   . Hyperlipemia   . Hypertension     Past Surgical History:  Procedure Laterality  Date  . CAROTID ENDARTERECTOMY    . CHOLECYSTECTOMY    . hernia surgery    . right ankle replacement    . right leg surgery     x 2  . TONSILLECTOMY      Family History  Problem Relation Age of Onset  . Heart disease Father   . Heart disease Brother   . Diabetes Brother   . Prostate cancer Brother   . Heart disease Sister   . Diabetes Sister   . Lung disease Neg Hx   . Rheumatologic disease Neg Hx   . Heart disease Other     Social History   Social History  . Marital status: Married    Spouse name: N/A  . Number of children: N/A  . Years of education: N/A   Occupational History  . Retired     Customer service manager   Social History Main Topics  . Smoking status: Never Smoker  . Smokeless tobacco: Never Used  . Alcohol use 0.0 oz/week     Comment: social  . Drug use: No  . Sexual activity: Not Asked   Other Topics Concern  . None   Social History Narrative   Originally from [place]. Has lived in [place], [place], [place], [place]. Remote travel to [place], [place], & [place]. Previously worked Education administrator. Currently has a dog. No bird, mold,  asbestos, or hot tub exposure. Patient does have a pool. Enjoys doing yard work. Remote exposure to silage and hay. Also worked with cattle.       Objective:   Physical Exam BP 138/64 (BP Location: Left Arm, Cuff Size: Normal)   Pulse (!) 52   Ht 5' 9"  (1.753 m)   Wt 155 lb 6.4 oz (70.5 kg)   SpO2 93%   BMI 22.95 kg/m  General:  Awake. No distress. Accompanied by wife today.  Integument:  Warm & dry. No rash on exposed skin.  Lymphatics:  No appreciated cervical or supraclavicular lymphadenoapthy. HEENT:  Moist mucus membranes. No oral ulcers. No scleral injection. No nasal turbinate swelling. Cardiovascular:  Regular rate & rhythm. Trace edema. Normal S1 & S2.  Pulmonary:  Basilar predominant crackles persist and are unchanged. Normal work of breathing on room air. Speaking in complete sentences. Musculoskeletal:  Normal muscle bulk. Hand  grip 5/5 bilaterally.   6MWT 10/21/15:  Walked 324 meters / Baseline Sat 95% on RA / Nadir Sat 88% on RA @ end of test  IMAGING HRCT CHEST W/O 09/27/15 (previously reviewed by me):No pericardial effusion. No pleural effusion or thickening. Traction bronchiectasis with subpleural and primary bronchovascular reticular changes as well as groundglass. Involvement of both apical and basilar regions. Early honeycomb changes. Small to moderate hiatal hernia. Calcifications noted within the liver and spleen. No axillary adenopathy. Enlarged mediastinal lymph nodes measuring up to 1.8 cm in short axis present.  CT CHEST/ABD/PELVIS W/ 08/19/15 (previously reviewed by me): Mediastinal lymphadenopathy noted with the largest lymph node measuring 1.6 cm at level 7, 1.3 cm 4L, & 1.1 cm 4R positions. Calcifications noted within the spleen and liver suggestive of prior granulomatous disease. No pleural effusion or thickening. Patchy bilateral intralobular septal thickening and reticulation consistent with interstitial lung disease. This appears both centrally in the lungs as well as peripherally occurring throughout all fields with slightly less involvement in the upper lung zones. Suggestion of traction bronchiectasis. No obvious groundglass opacities. There is also suggestion of honeycomb changes. Remote T-8 compression fracture with advanced anterior height loss. Mild finding enlargement of left inguinal canal. Renal cysts on the left noted with mild renal atrophy and no hydronephrosis. Enlarged central prostate that is symmetric. Colonic diverticulosis noted. No appendicitis. No mass or adenopathy. No ascites or pneumoperitoneum.  PORT CXR 02/01/09 (previously reviewed by me): Heart appears to be largely normal in size and unchanged when compared with previous portable chest x-ray from 2009. Mediastinum normal in contour. No obvious parenchymal opacities or increased interstitial markings. No pleural  effusion.  LABS 10/21/15 Anti-CCP:  <16 RF:  <10 Chromatin antibody:  <0.2 Centromere antibody screen:  <1.0 Double-stranded DNA antibody: 1 ENA Smith antibody:  <1 SSA:   <1.0 SSB:   <1.0 SCL 70:   <1.0  09/22/15 ProBNP: 97.0 CRP: 0.5 ESR: 16 ANA: Negative Hypersensitivity Pneumonitis Panel: Negative   08/15/15 CBC: 9.1/14.2/43.2/239 BMP: 1:30/3.9/105/24/20/1.43/98/9.0 LFT: 3.8/6.7/0.4/73/19/20 ESR: 11 CRP: 5.5    Assessment & Plan:  82 y.o. male with underlying interstitial lung disease. Symptomatically the patient seems to be well-controlled at this time. His reflux additionally seems to be under control. He remains asymptomatic with regards to his mediastinal lymphadenopathy which is of unclear significance. We did discuss the possibility of underlying malignancy/lymphoma with his lymphadenopathy. Currently we are deferring surgical lung biopsy and mediastinal lymph node biopsy given his asymptomatic presentation. Reviewed his serum testing with him and his wife which has been negative for any  autoimmune process that could be contributing to his interstitial lung disease. At this time I do not feel he would be of great symptomatic benefit from initiation of immunosuppression. Plan to continue to follow pulmonary testing to determine whether or not he is having any subclinical decline in his lung function. I instructed the patient contact my office if he had any new breathing problems or questions before next appointment.  1. ILD/NSIP Pattern:  Continuing to hold on initiation of immunosuppression. Checking spirometry with DLCO next appointment as well as 6 minute walk test on room air. 2. Mediastinal Lymphadenopathy: Seen on imaging April 2017. Repeat CT chest without contrast October 2017. 3. GERD: Continuing Protonix. Well controlled. 4. Health Maintenance:  Managed by his PCP. He reports he is previously had Pneumovax and Prevnar vaccines. Plan for influenza vaccine at the  end of September. 5. Follow-up: Patient to return to clinic in 3 months or sooner if needed.  Sonia Baller Ashok Cordia, M.D. [facility] Pulmonary & Critical Care Pager:  [phone] After 3pm or if no response, call [phone] 9:05 AM 01/18/16

## 2016-01-26 DIAGNOSIS — Z01818 Encounter for other preprocedural examination: Secondary | ICD-10-CM | POA: Diagnosis not present

## 2016-01-26 DIAGNOSIS — H401133 Primary open-angle glaucoma, bilateral, severe stage: Secondary | ICD-10-CM | POA: Diagnosis not present

## 2016-02-01 DIAGNOSIS — Z23 Encounter for immunization: Secondary | ICD-10-CM | POA: Diagnosis not present

## 2016-02-07 DIAGNOSIS — H401110 Primary open-angle glaucoma, right eye, stage unspecified: Secondary | ICD-10-CM | POA: Diagnosis not present

## 2016-02-07 DIAGNOSIS — H401113 Primary open-angle glaucoma, right eye, severe stage: Secondary | ICD-10-CM | POA: Diagnosis not present

## 2016-02-07 DIAGNOSIS — H409 Unspecified glaucoma: Secondary | ICD-10-CM | POA: Diagnosis not present

## 2016-02-27 DIAGNOSIS — K449 Diaphragmatic hernia without obstruction or gangrene: Secondary | ICD-10-CM | POA: Diagnosis not present

## 2016-02-27 DIAGNOSIS — I2510 Atherosclerotic heart disease of native coronary artery without angina pectoris: Secondary | ICD-10-CM | POA: Diagnosis not present

## 2016-02-27 DIAGNOSIS — R591 Generalized enlarged lymph nodes: Secondary | ICD-10-CM | POA: Diagnosis not present

## 2016-02-27 DIAGNOSIS — I700 Atherosclerosis of aorta: Secondary | ICD-10-CM | POA: Diagnosis not present

## 2016-02-27 DIAGNOSIS — J849 Interstitial pulmonary disease, unspecified: Secondary | ICD-10-CM | POA: Diagnosis not present

## 2016-02-27 DIAGNOSIS — R599 Enlarged lymph nodes, unspecified: Secondary | ICD-10-CM | POA: Diagnosis not present

## 2016-03-07 ENCOUNTER — Telehealth: Payer: Self-pay | Admitting: Pulmonary Disease

## 2016-03-07 NOTE — Telephone Encounter (Signed)
Called and spoke to pt's wife, Danton Clap. Danton Clap is requesting results of CT chest that was done at [facility] - in PACS.   Dr. Ashok Cordia, please advise on results. Thanks.

## 2016-03-08 DIAGNOSIS — J449 Chronic obstructive pulmonary disease, unspecified: Secondary | ICD-10-CM | POA: Diagnosis not present

## 2016-03-08 DIAGNOSIS — R634 Abnormal weight loss: Secondary | ICD-10-CM | POA: Diagnosis not present

## 2016-03-12 NOTE — Telephone Encounter (Signed)
JN please advise on results. Thanks! 

## 2016-03-12 NOTE — Telephone Encounter (Signed)
I have a reminder to me to review this when I am back in the office. I don't have access to PACs while in hospital. We will let them know on 11/13 when I am back in office. Thanks.

## 2016-03-12 NOTE — Telephone Encounter (Signed)
I called the pts wife and she is aware that JN will call once he has reviewed the scan.  Nothing further is needed.

## 2016-03-15 ENCOUNTER — Telehealth: Payer: Self-pay | Admitting: Pulmonary Disease

## 2016-03-15 NOTE — Telephone Encounter (Signed)
Called and spoke to pt. Pt is requesting the results of his recent CT chest scan. Informed pt of the recs per JN from 10.25.17 phone note. Pt verbalized understanding and denied any further questions or concerns at this time.

## 2016-03-27 NOTE — Telephone Encounter (Signed)
Please let the patient and his wife know I reviewed his CT scan result myself. The lymph nodes have not changed in size. This is encouraging that these are unlikely to represent a lymphoma or cancer. We will discuss this further at his follow-up in December. Thank you.

## 2016-03-28 NOTE — Telephone Encounter (Signed)
Results have been explained to patient, pt expressed understanding. Nothing further needed.  

## 2016-04-16 DIAGNOSIS — H401133 Primary open-angle glaucoma, bilateral, severe stage: Secondary | ICD-10-CM | POA: Diagnosis not present

## 2016-04-23 ENCOUNTER — Ambulatory Visit (INDEPENDENT_AMBULATORY_CARE_PROVIDER_SITE_OTHER): Payer: Medicare Other | Admitting: Pulmonary Disease

## 2016-04-23 ENCOUNTER — Encounter: Payer: Self-pay | Admitting: Pulmonary Disease

## 2016-04-23 VITALS — BP 118/64 | HR 61 | Ht 67.5 in | Wt 151.0 lb

## 2016-04-23 DIAGNOSIS — J9601 Acute respiratory failure with hypoxia: Secondary | ICD-10-CM | POA: Diagnosis not present

## 2016-04-23 DIAGNOSIS — R0602 Shortness of breath: Secondary | ICD-10-CM

## 2016-04-23 DIAGNOSIS — J84112 Idiopathic pulmonary fibrosis: Secondary | ICD-10-CM | POA: Insufficient documentation

## 2016-04-23 DIAGNOSIS — K219 Gastro-esophageal reflux disease without esophagitis: Secondary | ICD-10-CM

## 2016-04-23 DIAGNOSIS — J849 Interstitial pulmonary disease, unspecified: Secondary | ICD-10-CM

## 2016-04-23 DIAGNOSIS — R590 Localized enlarged lymph nodes: Secondary | ICD-10-CM

## 2016-04-23 HISTORY — DX: Idiopathic pulmonary fibrosis: J84.112

## 2016-04-23 LAB — PULMONARY FUNCTION TEST
DL/VA % pred: 46 %
DL/VA: 2.03 ml/min/mmHg/L
DLCO UNC % PRED: 31 %
DLCO UNC: 9.13 ml/min/mmHg
DLCO cor % pred: 31 %
DLCO cor: 9.13 ml/min/mmHg
FEF 25-75 Pre: 2.98 L/sec
FEF2575-%PRED-PRE: 201 %
FEV1-%PRED-PRE: 102 %
FEV1-PRE: 2.38 L
FEV1FVC-%PRED-PRE: 122 %
FEV6-%PRED-PRE: 89 %
FEV6-Pre: 2.78 L
FEV6FVC-%PRED-PRE: 108 %
FVC-%Pred-Pre: 82 %
FVC-PRE: 2.78 L
PRE FEV1/FVC RATIO: 86 %
Pre FEV6/FVC Ratio: 100 %

## 2016-04-23 NOTE — Progress Notes (Signed)
Subjective:    Patient ID: Casey [name], male    DOB: 1929-03-25, 82 y.o.   MRN: [id number]  C.C.:  Follow-up for ILD, Acute Hypoxic Respiratory Failure, Mediastinal Adenopathy, & GERD.  HPI ILD:  Fibrotic NSIP vs Atypical UIP pattern. He reports his breathing is doing "fine". He reports he does have dyspnea on exertion, such as cutting wood. He reports a mild, intermittent cough. Cough rarely produces a "white" mucus.  Acute Hypoxic Respiratory Failure:  Patient demonstrated a 3 L/m requirement with exertion during his walk test today. No near syncope.   Mediastinal Adenopathy:  Noted on CT imaging in April 2017. No change in repeat CT chest in October. No weight loss that he is aware. No noticeable adenopathy in his groin, axilla, or neck.   GERD:  Prescribed Protonix. No morning brash water taste. No reflux or dyspepsia.   Review of Systems No fever, chills, or sweats. No chest pain, tightness, or pressure. No new rashes, bruising, or joint pain.   No Known Allergies  Current Outpatient Prescriptions on File Prior to Visit  Medication Sig Dispense Refill  . aspirin EC 81 MG tablet Take 1 tablet by mouth daily.    Marland Kitchen atorvastatin (LIPITOR) 20 MG tablet Take 1 tablet by mouth daily.    . Cholecalciferol (VITAMIN D3) 2000 units TABS Take by mouth daily.    . cloNIDine (CATAPRES) 0.1 MG tablet Take 1 tablet by mouth at bedtime.    . Coenzyme Q10 (CO Q-10) 100 MG CAPS Take by mouth daily.    . Dorzolamide HCl-Timolol Mal (COSOPT OP) daily.    . Enalapril-Hydrochlorothiazide 5-12.5 MG tablet Take 1 tablet by mouth every morning.    . latanoprost (XALATAN) 0.005 % ophthalmic solution 2 (two) times daily.    Marland Kitchen LUMIGAN 0.01 % SOLN daily.  10  . pantoprazole (PROTONIX) 40 MG tablet Take 1 tablet by mouth daily.     No current facility-administered medications on file prior to visit.     Past Medical History:  Diagnosis Date  . Carotid artery stenosis   . GERD (gastroesophageal  reflux disease)   . Glaucoma   . Hyperlipemia   . Hypertension     Past Surgical History:  Procedure Laterality Date  . CAROTID ENDARTERECTOMY    . CHOLECYSTECTOMY    . hernia surgery    . right ankle replacement    . right leg surgery     x 2  . TONSILLECTOMY      Family History  Problem Relation Age of Onset  . Heart disease Father   . Heart disease Brother   . Diabetes Brother   . Prostate cancer Brother   . Heart disease Sister   . Diabetes Sister   . Lung disease Neg Hx   . Rheumatologic disease Neg Hx   . Heart disease Other     Social History   Social History  . Marital status: Married    Spouse name: N/A  . Number of children: N/A  . Years of education: N/A   Occupational History  . Retired     Customer service manager   Social History Main Topics  . Smoking status: Never Smoker  . Smokeless tobacco: Never Used  . Alcohol use 0.0 oz/week     Comment: social  . Drug use: No  . Sexual activity: Not Asked   Other Topics Concern  . None   Social History Narrative   Originally from [place]. Has lived in [place], [place],  [place], & [place]. Remote travel to [place], [place], & [place]. Previously worked Education administrator. Currently has a dog. No bird, mold, asbestos, or hot tub exposure. Patient does have a pool. Enjoys doing yard work. Remote exposure to silage and hay. Also worked with cattle.       Objective:   Physical Exam BP 118/64 (BP Location: Left Arm, Cuff Size: Normal)   Pulse 61   Ht 5' 7.5" (1.715 m)   Wt 151 lb (68.5 kg)   SpO2 94%   BMI 23.30 kg/m  General:  Awake. No distress. Accompanied by wife today.  Integument:  Warm & dry. No rash on exposed skin. Mild bruising of various ages on exposed skin. Lymphatics:  No appreciated cervical or supraclavicular lymphadenoapthy. HEENT:  Moist mucus membranes. No oral ulcers. No scleral injection. No nasal turbinate swelling. Cardiovascular:  Regular rate & rhythm.  No edema. Normal S1 & S2.  Pulmonary:  As a Velcro  crackles persist. Normal work of breathing on room air and speaking in complete sentences. Musculoskeletal:  Normal muscle bulk. Hand grip 5/5 bilaterally. No joint effusion appreciated.   PFT 04/23/16: FVC 2.70 L (82%) FEV1 2.38 L (102%) FEV1/FVC 0.86 FEF 25-75 2.98 L (201%) DLCO corrected 31% (hgb 14.6)  6MWT 04/23/16:  Walked 354 meters / Baseline Sat 96% on RA / Nadir Sat 83% on RA @ end of test (required 3 L/m to maintain with repeat walk) 10/21/15:  Walked 324 meters / Baseline Sat 95% on RA / Nadir Sat 88% on RA @ end of test  IMAGING CT CHEST W/O 02/27/16 (personally reviewed by me):  Bilateral intralobular septal thickening and fibrotic changes with traction bronchiectasis relatively unchanged when compared with previous CT imaging. Mediastinal adenopathy persists with largest lymph node is subcarinal measuring approximately 1.6 cm in short axis. No pleural effusion or thickening. No pericardial effusion. No developing nodule or mass.  HRCT CHEST W/O 09/27/15 (previously reviewed by me):No pericardial effusion. No pleural effusion or thickening. Traction bronchiectasis with subpleural and primary bronchovascular reticular changes as well as groundglass. Involvement of both apical and basilar regions. Early honeycomb changes. Small to moderate hiatal hernia. Calcifications noted within the liver and spleen. No axillary adenopathy. Enlarged mediastinal lymph nodes measuring up to 1.8 cm in short axis present.  CT CHEST/ABD/PELVIS W/ 08/19/15 (previously reviewed by me): Mediastinal lymphadenopathy noted with the largest lymph node measuring 1.6 cm at level 7, 1.3 cm 4L, & 1.1 cm 4R positions. Calcifications noted within the spleen and liver suggestive of prior granulomatous disease. No pleural effusion or thickening. Patchy bilateral intralobular septal thickening and reticulation consistent with interstitial lung disease. This appears both centrally in the lungs as well as peripherally occurring  throughout all fields with slightly less involvement in the upper lung zones. Suggestion of traction bronchiectasis. No obvious groundglass opacities. There is also suggestion of honeycomb changes. Remote T-8 compression fracture with advanced anterior height loss. Mild finding enlargement of left inguinal canal. Renal cysts on the left noted with mild renal atrophy and no hydronephrosis. Enlarged central prostate that is symmetric. Colonic diverticulosis noted. No appendicitis. No mass or adenopathy. No ascites or pneumoperitoneum.  PORT CXR 02/01/09 (previously reviewed by me): Heart appears to be largely normal in size and unchanged when compared with previous portable chest x-ray from 2009. Mediastinum normal in contour. No obvious parenchymal opacities or increased interstitial markings. No pleural effusion.  LABS 10/21/15 Anti-CCP:  <16 RF:  <10 Chromatin antibody:  <0.2 Centromere antibody screen:  <1.0  Double-stranded DNA antibody: 1 ENA Smith antibody:  <1 SSA:   <1.0 SSB:   <1.0 SCL 70:   <1.0  09/22/15 ProBNP: 97.0 CRP: 0.5 ESR: 16 ANA: Negative Hypersensitivity Pneumonitis Panel: Negative   08/15/15 CBC: 9.1/14.2/43.2/239 BMP: 1:30/3.9/105/24/20/1.43/98/9.0 LFT: 3.8/6.7/0.4/73/19/20 ESR: 11 CRP: 5.5    Assessment & Plan:  82 y.o. male with underlying interstitial lung disease with fibrotic NSIP vs atypical UIP pattern. Patient now has an oxygen requirement with ambulation today. I had a lengthy discussion today with the patient regarding his new oxygen requirement with exertion which I feel could indicate progression of his underlying interstitial lung disease. The pattern on his CT imaging and clinical history as well as demographic are most consistent with idiopathic pulmonary fibrosis. Reviewing his previous CT imaging is mediastinal lymphadenopathy has not significantly changed nor have his underlying fibrotic findings. We did discuss initiating oxygen therapy for his  acute hypoxic respiratory failure with the patient declines at this time. I did caution him regarding the potential for cardiac arrhythmias and deleterious effects on his myocardial function. We also discussed starting the patient on treatment with Ofev/Esbriet for his IPF reviewing the potential for side effects. At this time as the patient is relatively asymptomatic he wishes to hold off and simply repeat testing at a shorter interval. We discussed the fact that these drugs do not lengthen life expectancy but rather preserved lung function with time. I instructed the patient to contact my office if he had any new breathing problems or questions before his next appointment.  1. ILD/Fibrotic NSIP vs Atypical UIP Pattern:  Patient declining initiation of therapy at this time. Repeat spirometry with DLCO at next appointment. 2. Acute Hypoxic respiratory Failure:  Declines oxygen therapy at this time. Repeat 6 minute walk test with oxygen titration at next appointment. 3. Mediastinal Lymphadenopathy: Stable on repeat imaging. Holding on further imaging pending new symptoms. 4. GERD: Continuing Protonix as he is well controlled. 5. Health Maintenance:  Managed by his PCP. He reports he is previously had Pneumovax and Prevnar vaccines. Influenza Vaccine September 2017. 6. Follow-up: Patient to return to clinic in 8 weeks or sooner if neeed.   Sonia Baller Ashok Cordia, M.D. [facility] Pulmonary & Critical Care Pager:  [phone] After 3pm or if no response, call [phone] 12:23 PM 04/23/16

## 2016-04-23 NOTE — Patient Instructions (Addendum)
   Call me if you have any new breathing problems before your next appointment.  I will see you back in 8 weeks or sooner with breathing tests.  TESTS ORDERED: 1. Spirometry with DLCO at next appointment 2. 6MWT on room air at next appointment - with oxygen titration if necessary

## 2016-04-23 NOTE — Progress Notes (Signed)
Test reviewed.  

## 2016-05-17 ENCOUNTER — Telehealth: Payer: Self-pay | Admitting: Pulmonary Disease

## 2016-05-17 DIAGNOSIS — R0902 Hypoxemia: Secondary | ICD-10-CM | POA: Diagnosis not present

## 2016-05-17 NOTE — Telephone Encounter (Signed)
Spoke with pt's wife, states that a family friend who is a Therapist, sports believes that pt needs ambulatory 02.  I advised pt wife we would need to evaluate him in clinic to see if he qualifies for 02.  Ov made with NP at pt wife request for evaluation.  Nothing further needed.

## 2016-05-17 NOTE — Telephone Encounter (Signed)
Spoke with patient's wife. Evidently, a church member was very concerned regarding his desaturation with exertion. Wife reports his saturation has remained normal for the most part except that it does seem to get into the mid to upper 80s with significant exertion. Patient has an appointment with nurse practitioner on 1/12 to further address patient and patient's wife concerns. He has appointment with me in February with a 6 minute walk test and repeat pulmonary function testing. We may need to initiate treatment at that time depending upon his pulmonary function and symptoms. At this time his symptoms are minimal to none.

## 2016-05-18 DIAGNOSIS — R0902 Hypoxemia: Secondary | ICD-10-CM | POA: Diagnosis not present

## 2016-05-25 ENCOUNTER — Ambulatory Visit (INDEPENDENT_AMBULATORY_CARE_PROVIDER_SITE_OTHER): Payer: Medicare Other | Admitting: Adult Health

## 2016-05-25 ENCOUNTER — Encounter: Payer: Self-pay | Admitting: Adult Health

## 2016-05-25 DIAGNOSIS — J849 Interstitial pulmonary disease, unspecified: Secondary | ICD-10-CM | POA: Diagnosis not present

## 2016-05-25 DIAGNOSIS — J9611 Chronic respiratory failure with hypoxia: Secondary | ICD-10-CM

## 2016-05-25 NOTE — Progress Notes (Signed)
@Patient  ID: Casey [name], male    DOB: 1928/11/07, 83 y.o.   MRN: [id number]  Chief Complaint  Patient presents with  . 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 Mediastinal adenopathy persists with largest lymph node is subcarinal measuring approximately 1.6 cm in short axis. No pleural effusion or thickening. No pericardial effusion. No developing nodule or mass.  HRCT CHEST W/O 09/27/15 (previously reviewed by me):No pericardial effusion. No pleural effusion or thickening. Traction bronchiectasis with subpleural and primary bronchovascular reticular changes as well as groundglass. Involvement of both apical and basilar regions. Early honeycomb changes. Small to moderate hiatal hernia. Calcifications noted within the liver and spleen. No axillary adenopathy. Enlarged mediastinal lymph nodes measuring up to 1.8 cm in short axis present.  CT CHEST/ABD/PELVIS W/ 08/19/15 (previously reviewed by me): Mediastinal lymphadenopathy noted with the largest lymph node measuring 1.6 cm at level 7, 1.3 cm 4L, & 1.1 cm 4R positions. Calcifications noted within the spleen and liver suggestive of prior granulomatous disease. No pleural effusion or thickening. Patchy bilateral intralobular septal thickening and reticulation consistent  with interstitial lung disease. This appears both centrally in the lungs as well as peripherally occurring throughout all fields with slightly less involvement in the upper lung zones. Suggestion of traction bronchiectasis. No obvious groundglass opacities. There is also suggestion of honeycomb changes. Remote T-8 compression fracture with advanced anterior height loss. Mild finding enlargement of left inguinal canal. Renal cysts on the left noted with mild renal atrophy and no hydronephrosis. Enlarged central prostate that is symmetric. Colonic diverticulosis noted. No appendicitis. No mass or adenopathy. No ascites or pneumoperitoneum.  PORT CXR 02/01/09 (previously reviewed by me): Heart appears to be largely normal in size and unchanged when compared with previous portable chest x-ray from 2009. Mediastinum normal in contour. No obvious parenchymal opacities or increased interstitial markings. No pleural effusion.  LABS 10/21/15 Anti-CCP:  <16 RF:  <10 Chromatin antibody:  <0.2 Centromere antibody screen:  <1.0 Double-stranded DNA antibody: 1 ENA Smith antibody:  <1 SSA:   <1.0 SSB:   <1.0 SCL 70:   <1.0  09/22/15 ProBNP: 97.0 CRP: 0.5 ESR: 16 ANA: Negative Hypersensitivity Pneumonitis Panel: Negative   08/15/15 CBC: 9.1/14.2/43.2/239 BMP: 1:30/3.9/105/24/20/1.43/98/9.0 LFT: 3.8/6.7/0.4/73/19/20 ESR: 11 CRP: 5.5    05/25/16 Follow up : ILD  Pt returns for follow up for ILD .  He has recently been noted to desats mildly with ambulation but has been relatively asymptomatic. He wanted to hold off on O2 as long as possible. He had a neighbor bring over oxygen but does not want to use it if he does not have to. Walk test in office does have his O2 sat drop to mid 80s walking. On room air  with quick rebound.  He wants to hold off on Oxygen  At this time. Says he will check O2 at home and stop and rest if sats drop <88%.  He says he is very active and does not notice dypsnea.    Works out with horses , in yard. Splits wood. Does not feel sob  If has to do heavy labor that will cause him to be get winded. He will stop and rest .  Denies flare of cough , chest pain , orthopnea or edema.   No Known Allergies  Immunization History  Administered Date(s) Administered  . Influenza, High Dose Seasonal PF 02/10/2016  . Influenza-Unspecified 02/12/2015    Past Medical History:  Diagnosis Date  . Carotid artery stenosis   . GERD (gastroesophageal reflux disease)   . Glaucoma   . Hyperlipemia   . Hypertension     Tobacco History: History  Smoking Status  . Never Smoker  Smokeless Tobacco  . Never Used   Counseling given: Not Answered   Outpatient Encounter Prescriptions as of 05/25/2016  Medication Sig  . aspirin EC 81 MG tablet Take 1 tablet by mouth daily.  Marland Kitchen atorvastatin (LIPITOR) 20 MG tablet Take 1 tablet by mouth daily.  . Cholecalciferol (VITAMIN D3) 2000 units TABS Take by mouth daily.  . cloNIDine (CATAPRES) 0.1 MG tablet Take 1 tablet by mouth at bedtime.  . Coenzyme Q10 (CO Q-10) 100 MG CAPS Take by mouth daily.  . Dorzolamide HCl-Timolol Mal (COSOPT OP) daily.  . Enalapril-Hydrochlorothiazide 5-12.5 MG tablet Take 1 tablet by mouth every morning.  . latanoprost (XALATAN) 0.005 % ophthalmic solution 2 (two) times daily.  Marland Kitchen LUMIGAN 0.01 % SOLN daily.  . pantoprazole (PROTONIX) 40 MG tablet Take 1 tablet by mouth daily.   No facility-administered encounter medications on file as of 05/25/2016.      Review of Systems  Constitutional:   No  weight loss, night sweats,  Fevers, chills, fatigue, or  lassitude.  HEENT:   No headaches,  Difficulty swallowing,  Tooth/dental problems, or  Sore throat,                No sneezing, itching, ear ache, nasal congestion, post nasal drip,   CV:  No chest pain,  Orthopnea, PND, swelling in lower extremities, anasarca, dizziness, palpitations, syncope.   GI  No heartburn, indigestion, abdominal pain,  nausea, vomiting, diarrhea, change in bowel habits, loss of appetite, bloody stools.   Resp: No shortness of breath with exertion or at rest.  No excess mucus, no productive cough,  No non-productive cough,  No coughing up of blood.  No change in color of mucus.  No wheezing.  No chest wall deformity  Skin: no rash or lesions.  GU: no dysuria, change in color of urine, no urgency or frequency.  No flank pain, no hematuria   MS:  No joint pain or swelling.  No decreased range of motion.  No back pain.    Physical Exam  BP 136/74   Pulse 69   Ht 5' 7.5" (1.715 m)   Wt 149 lb 6.4 oz (67.8 kg)   SpO2 91%   BMI 23.05 kg/m   GEN: A/Ox3; pleasant , NAD, elderly    HEENT:  [place]/AT,  EACs-clear, TMs-wnl, NOSE-clear, THROAT-clear, no lesions, no postnasal drip or exudate noted.   NECK:  Supple w/ fair ROM; no JVD; normal carotid impulses w/o bruits; no thyromegaly or nodules palpated; no lymphadenopathy.    RESP  Decreased BS in bases ,  no accessory muscle use, no dullness to percussion  CARD:  RRR, no m/r/g, no peripheral edema, pulses intact, no cyanosis or clubbing.  GI:   Soft & nt; nml bowel sounds; no organomegaly or masses detected.   Musco: Warm bil, no deformities or joint swelling noted.   Neuro: alert, no focal deficits noted.    Skin: Warm, no lesions or rashes  Psych:  No change in mood or affect. No depression or anxiety.  No memory loss.  Lab Results:  CBC No results found for: WBC, RBC, HGB, HCT, PLT, MCV, MCH, MCHC, RDW, LYMPHSABS, MONOABS, EOSABS, BASOSABS  BMET No results found for: NA, K, CL, CO2, GLUCOSE, BUN, CREATININE, CALCIUM, GFRNONAA, GFRAA  BNP No results found for: BNP  ProBNP    Component Value Date/Time   PROBNP 97.0 09/22/2015 1311    Imaging: No results found.   Assessment & Plan:   ILD (interstitial lung disease) ([place]) Continue to monitor  .keep follow up with DR. Nestor .    Chronic respiratory failure ([facility]) ILD with  Hypoxemia - exertional desats, mild with quick rebound at rest and asymptomatic  Pt would like to hold off on O2.  Continue to monitor      Rexene Edison, NP 06/01/2016

## 2016-05-25 NOTE — Patient Instructions (Signed)
We will hold off on oxygen for now  Check oxygen at home , while you are walking try to stop and rest if O2 sats <88%.  Follow up in Feb as planned with Dr. Ashok Cordia with walk test and PFT  Please contact office for sooner follow up if symptoms do not improve or worsen or seek emergency care

## 2016-06-01 DIAGNOSIS — J9610 Chronic respiratory failure, unspecified whether with hypoxia or hypercapnia: Secondary | ICD-10-CM | POA: Insufficient documentation

## 2016-06-01 HISTORY — DX: Chronic respiratory failure, unspecified whether with hypoxia or hypercapnia: J96.10

## 2016-06-01 NOTE — Assessment & Plan Note (Signed)
Continue to monitor  .keep follow up with DR. Nestor .

## 2016-06-01 NOTE — Assessment & Plan Note (Signed)
ILD with Hypoxemia - exertional desats, mild with quick rebound at rest and asymptomatic  Pt would like to hold off on O2.  Continue to monitor

## 2016-06-19 ENCOUNTER — Encounter: Payer: Self-pay | Admitting: Pulmonary Disease

## 2016-06-19 ENCOUNTER — Ambulatory Visit (INDEPENDENT_AMBULATORY_CARE_PROVIDER_SITE_OTHER): Payer: Medicare Other | Admitting: Pulmonary Disease

## 2016-06-19 ENCOUNTER — Other Ambulatory Visit (INDEPENDENT_AMBULATORY_CARE_PROVIDER_SITE_OTHER): Payer: Medicare Other

## 2016-06-19 VITALS — BP 102/64 | HR 57 | Ht 67.5 in | Wt 147.0 lb

## 2016-06-19 DIAGNOSIS — J84112 Idiopathic pulmonary fibrosis: Secondary | ICD-10-CM

## 2016-06-19 DIAGNOSIS — J9611 Chronic respiratory failure with hypoxia: Secondary | ICD-10-CM

## 2016-06-19 DIAGNOSIS — J849 Interstitial pulmonary disease, unspecified: Secondary | ICD-10-CM

## 2016-06-19 DIAGNOSIS — K219 Gastro-esophageal reflux disease without esophagitis: Secondary | ICD-10-CM | POA: Diagnosis not present

## 2016-06-19 DIAGNOSIS — R590 Localized enlarged lymph nodes: Secondary | ICD-10-CM

## 2016-06-19 DIAGNOSIS — R0602 Shortness of breath: Secondary | ICD-10-CM

## 2016-06-19 LAB — COMPREHENSIVE METABOLIC PANEL
ALT: 25 U/L (ref 0–53)
AST: 27 U/L (ref 0–37)
Albumin: 3.8 g/dL (ref 3.5–5.2)
Alkaline Phosphatase: 71 U/L (ref 39–117)
BUN: 26 mg/dL — ABNORMAL HIGH (ref 6–23)
CO2: 27 meq/L (ref 19–32)
Calcium: 9.5 mg/dL (ref 8.4–10.5)
Chloride: 102 mEq/L (ref 96–112)
Creatinine, Ser: 1.37 mg/dL (ref 0.40–1.50)
GFR: 52.42 mL/min — AB (ref >60.00)
GLUCOSE: 103 mg/dL — AB (ref 70–99)
POTASSIUM: 4.4 meq/L (ref 3.5–5.1)
SODIUM: 137 meq/L (ref 135–145)
Total Bilirubin: 0.6 mg/dL (ref 0.2–1.2)
Total Protein: 7.4 g/dL (ref 6.0–8.3)

## 2016-06-19 LAB — PULMONARY FUNCTION TEST
DL/VA % PRED: 48 %
DL/VA: 2.13 ml/min/mmHg/L
DLCO COR % PRED: 27 %
DLCO COR: 7.89 ml/min/mmHg
DLCO unc % pred: 27 %
DLCO unc: 8 ml/min/mmHg
FEF 25-75 PRE: 3.26 L/s
FEF2575-%PRED-PRE: 221 %
FEV1-%Pred-Pre: 107 %
FEV1-Pre: 2.48 L
FEV1FVC-%Pred-Pre: 117 %
FEV6-%PRED-PRE: 95 %
FEV6-PRE: 2.97 L
FEV6FVC-%Pred-Pre: 108 %
FVC-%Pred-Pre: 89 %
FVC-Pre: 3.02 L
PRE FEV6/FVC RATIO: 100 %
Pre FEV1/FVC ratio: 82 %

## 2016-06-19 LAB — HEPATIC FUNCTION PANEL
ALBUMIN: 3.8 g/dL (ref 3.5–5.2)
ALT: 25 U/L (ref 0–53)
AST: 27 U/L (ref 0–37)
Alkaline Phosphatase: 71 U/L (ref 39–117)
Bilirubin, Direct: 0.1 mg/dL (ref 0.0–0.3)
TOTAL PROTEIN: 7.4 g/dL (ref 6.0–8.3)
Total Bilirubin: 0.6 mg/dL (ref 0.2–1.2)

## 2016-06-19 NOTE — Progress Notes (Signed)
Test reviewed.  

## 2016-06-19 NOTE — Progress Notes (Signed)
Subjective:    Patient ID: Casey [name], male    DOB: 11-05-1928, 83 y.o.   MRN: [id number]  C.C.:  Follow-up for ILD, Acute Hypoxic Respiratory Failure, Mediastinal Adenopathy, & GERD.  HPI ILD:  Patient has an atypical UIP pattern on high-resolution CT scan. Serum workup negative and pattern most consistent with IPF. He reports no change in his baseline dyspnea. He continues to have a rare, nonproductive cough. No wheezing.   Acute Hypoxic Respiratory Failure:  Previously demonstrated an oxygen requirement of 3 L/m with exertion. Continues to demonstrate an oxygen requirement of 2 L/m with exertion given significant desaturation today.  GERD:  Currently prescribed Protonix. No reflux, dyspepsia, or morning brash water taste.   Mediastinal Adenopathy:  First noted on CT imaging in April 2017. This had not changed significantly on repeat CT imaging in October. Holding off on further imaging pending new symptoms. No new adenopathy in his neck, groin, or axilla.   Review of Systems No chest pain, tightness or pressure. No fever, chills, or sweats. He has lost a couple of pounds since last appointment he believes. No headaches or near syncope.   No Known Allergies  Current Outpatient Prescriptions on File Prior to Visit  Medication Sig Dispense Refill  . aspirin EC 81 MG tablet Take 1 tablet by mouth daily.    Marland Kitchen atorvastatin (LIPITOR) 20 MG tablet Take 1 tablet by mouth daily.    . Cholecalciferol (VITAMIN D3) 2000 units TABS Take by mouth daily.    . cloNIDine (CATAPRES) 0.1 MG tablet Take 1 tablet by mouth at bedtime.    . Coenzyme Q10 (CO Q-10) 100 MG CAPS Take by mouth daily.    . Dorzolamide HCl-Timolol Mal (COSOPT OP) daily.    . Enalapril-Hydrochlorothiazide 5-12.5 MG tablet Take 1 tablet by mouth every morning.    . latanoprost (XALATAN) 0.005 % ophthalmic solution 2 (two) times daily.    Marland Kitchen LUMIGAN 0.01 % SOLN daily.  10  . pantoprazole (PROTONIX) 40 MG tablet Take 1 tablet by  mouth daily.     No current facility-administered medications on file prior to visit.     Past Medical History:  Diagnosis Date  . Carotid artery stenosis   . GERD (gastroesophageal reflux disease)   . Glaucoma   . Hyperlipemia   . Hypertension     Past Surgical History:  Procedure Laterality Date  . CAROTID ENDARTERECTOMY    . CHOLECYSTECTOMY    . hernia surgery    . right ankle replacement    . right leg surgery     x 2  . TONSILLECTOMY      Family History  Problem Relation Age of Onset  . Heart disease Father   . Heart disease Brother   . Diabetes Brother   . Prostate cancer Brother   . Heart disease Sister   . Diabetes Sister   . Lung disease Neg Hx   . Rheumatologic disease Neg Hx   . Heart disease Other     Social History   Social History  . Marital status: Married    Spouse name: N/A  . Number of children: N/A  . Years of education: N/A   Occupational History  . Retired     Customer service manager   Social History Main Topics  . Smoking status: Never Smoker  . Smokeless tobacco: Never Used  . Alcohol use 0.0 oz/week     Comment: social  . Drug use: No  . Sexual activity: Not  Asked   Other Topics Concern  . None   Social History Narrative   Originally from [place]. Has lived in [place], [place], [place], [place]. Remote travel to [place], [place], & [place]. Previously worked Education administrator. Currently has a dog. No bird, mold, asbestos, or hot tub exposure. Patient does have a pool. Enjoys doing yard work. Remote exposure to silage and hay. Also worked with cattle.       Objective:   Physical Exam BP 102/64 (BP Location: Left Arm, Patient Position: Sitting, Cuff Size: Normal)   Pulse (!) 57   Ht 5' 7.5" (1.715 m)   Wt 147 lb (66.7 kg)   SpO2 (!) 89%   BMI 22.68 kg/m   General:  No acute distress. Comfortable. Wife with patient today. Integument:  Warm & dry. No rash on exposed skin.  Extremities:  No cyanosis or clubbing.  Lymphatics: No appreciated cervical or  supraclavicular lymphadenopathy. HEENT:  Moist mucus membranes. No oral ulcers. No scleral injection or icterus.  Cardiovascular:  Regular rate. No edema. Normal S1 & S2.  Pulmonary:  No accessory muscle use on room air. Basilar Velcro crackles unchanged. Speaking in complete sentences. Abdomen: Soft. Normal bowel sounds. Nondistended.  Musculoskeletal:  Normal bulk and tone. No joint deformity or effusion appreciated.    PFT 06/19/16: FVC 3.02 L (89%) FEV1 2.48 L (107%) FEV1/FVC 0.82 FEF 25-75 3.26 L (221%) DLCO corrected 27% 04/23/16: FVC 2.70 L (82%) FEV1 2.38 L (102%) FEV1/FVC 0.86 FEF 25-75 2.98 L (201%) DLCO corrected 31% (hgb 14.6)  6MWT 06/19/16:  Walked 336 meters / Baseline Sat 93% on RA / Nadir Sat 83% on RA @ end of test (required 2 L/m to maintain with repeat walk) 04/23/16:  Walked 354 meters / Baseline Sat 96% on RA / Nadir Sat 83% on RA @ end of test (required 3 L/m to maintain with repeat walk) 10/21/15:  Walked 324 meters / Baseline Sat 95% on RA / Nadir Sat 88% on RA @ end of test  IMAGING CT CHEST W/O 02/27/16 (previously reviewed by me):  Bilateral intralobular septal thickening and fibrotic changes with traction bronchiectasis relatively unchanged when compared with previous CT imaging. Mediastinal adenopathy persists with largest lymph node is subcarinal measuring approximately 1.6 cm in short axis. No pleural effusion or thickening. No pericardial effusion. No developing nodule or mass.  HRCT CHEST W/O 09/27/15 (previously reviewed by me):No pericardial effusion. No pleural effusion or thickening. Traction bronchiectasis with subpleural and primary bronchovascular reticular changes as well as groundglass. Involvement of both apical and basilar regions. Early honeycomb changes. Small to moderate hiatal hernia. Calcifications noted within the liver and spleen. No axillary adenopathy. Enlarged mediastinal lymph nodes measuring up to 1.8 cm in short axis present.  CT  CHEST/ABD/PELVIS W/ 08/19/15 (previously reviewed by me): Mediastinal lymphadenopathy noted with the largest lymph node measuring 1.6 cm at level 7, 1.3 cm 4L, & 1.1 cm 4R positions. Calcifications noted within the spleen and liver suggestive of prior granulomatous disease. No pleural effusion or thickening. Patchy bilateral intralobular septal thickening and reticulation consistent with interstitial lung disease. This appears both centrally in the lungs as well as peripherally occurring throughout all fields with slightly less involvement in the upper lung zones. Suggestion of traction bronchiectasis. No obvious groundglass opacities. There is also suggestion of honeycomb changes. Remote T-8 compression fracture with advanced anterior height loss. Mild finding enlargement of left inguinal canal. Renal cysts on the left noted with mild renal atrophy and no hydronephrosis. Enlarged  central prostate that is symmetric. Colonic diverticulosis noted. No appendicitis. No mass or adenopathy. No ascites or pneumoperitoneum.  PORT CXR 02/01/09 (previously reviewed by me): Heart appears to be largely normal in size and unchanged when compared with previous portable chest x-ray from 2009. Mediastinum normal in contour. No obvious parenchymal opacities or increased interstitial markings. No pleural effusion.  LABS 10/21/15 Anti-CCP:  <16 RF:  <10 Chromatin antibody:  <0.2 Centromere antibody screen:  <1.0 Double-stranded DNA antibody: 1 ENA Smith antibody:  <1 SSA:   <1.0 SSB:   <1.0 SCL 70:   <1.0  09/22/15 ProBNP: 97.0 CRP: 0.5 ESR: 16 ANA: Negative Hypersensitivity Pneumonitis Panel: Negative   08/15/15 CBC: 9.1/14.2/43.2/239 BMP: 1:30/3.9/105/24/20/1.43/98/9.0 LFT: 3.8/6.7/0.4/73/19/20 ESR: 11 CRP: 5.5    Assessment & Plan:  83 y.o. male with underlying interstitial lung disease with an atypical UIP pattern along with acute hypoxic respiratory failure & GERD.Patient continues to demonstrate an  oxygen requirement of 2 L/m with exertion which is concerning. I believe he is rebounding from a flare of his underlying ILD/IPF. We discussed the potential for further clinical worsening and flares of his underlying IPF. At this time I feel it would be beneficial to start the patient on medication therapy to hopefully preserve lung function moving forward. We discussed the potential for adverse affects from starting Ofev including GI side effects as well as liver injury. I instructed the patient contact my office if he had any further questions or concerns before his next appointment.   1. ILD/IPF:  Starting patient on Ofev. Checking CMP today. Repeating hepatic function panel every 4 weeks after initiating medication therapy. Repeat spirometry with DLCO at next appointment. 2. Chronic Hypoxic respiratory Failure:  Patient continuing to decline oxygen therapy. Repeat 6 minute walk test with oxygen titration at next appointment. 3. GERD: Continuing Protonix as he is well controlled. 4. Health Maintenance:  Managed by his PCP. He reports he is previously had Pneumovax and Prevnar vaccines. Influenza Vaccine September 2017. 5. Follow-up: Patient to return to clinic in 3 months or sooner if needed.  Sonia Baller Ashok Cordia, M.D. [facility] Pager:  [phone] After 3pm or if no response, call [phone] 11:08 AM 06/19/16

## 2016-06-19 NOTE — Patient Instructions (Signed)
   Contact my office if you have any problems with the new medication we are starting for your lung disease.  You may experience some nausea, diarrhea, or stomach discomfort with the new medication.  We will need to monitor your blood work periodically while on this new medication as it can hurt your liver.  We will do a breathing & walking test at your next appointment.  TESTS ORDERED: 1. Serum CMP today 2. Hepatic Function Panel every 4 weeks after starting Ofev 3. Spirometry with DLCO at next appointment 4. 6MWT with oxygen titration at next appointment

## 2016-06-25 ENCOUNTER — Telehealth: Payer: Self-pay | Admitting: Pulmonary Disease

## 2016-06-25 DIAGNOSIS — J849 Interstitial pulmonary disease, unspecified: Secondary | ICD-10-CM

## 2016-06-25 NOTE — Telephone Encounter (Signed)
Spoke with Roselyn Reef at [employer]. She was checking on the status of the PA on Ofev. There is no documentation in the pt's chart that this process has been started. PA has been started on Cover My Meds with the key that was provided from [employer] >> [id number]. PA determination could take up to 24 hours. Will await PA decision.

## 2016-06-26 DIAGNOSIS — H401133 Primary open-angle glaucoma, bilateral, severe stage: Secondary | ICD-10-CM | POA: Diagnosis not present

## 2016-06-27 NOTE — Telephone Encounter (Signed)
Received a formulary or Tier Exception request form.  This has been placed in [place] look at to be completed.  Will forward to JN to make him aware.

## 2016-06-28 NOTE — Telephone Encounter (Signed)
PA for Ofev was denied d/t lack of clinical evidence- in order for insurance to cover this med, pt MUST have either a HRCT or an open lung biopsy confirming UIP patterns.  Pt has multiple ct chest imaging but none that are HRCT, and insurance will not accept this for approval.  JN please advise.

## 2016-06-28 NOTE — Telephone Encounter (Signed)
Casey [name] w/[employer] just spoke with patient stating PA was denied.  If would like assistance with appeal, please call [phone] ext [phone].

## 2016-06-29 NOTE — Telephone Encounter (Signed)
The patient has had a HRCT of the chest. Take a look at my note.

## 2016-07-02 NOTE — Telephone Encounter (Signed)
Appeal letter has been faxed to [employer]'s provided number. Received fax confirmation. Will route to Carleigh to f/u on.

## 2016-07-02 NOTE — Telephone Encounter (Signed)
[phone] Alen Blew calling back

## 2016-07-02 NOTE — Telephone Encounter (Signed)
[employer] calling  Just to let us know that they received fax and if any additional info is needed  Pleases name and Id # on it, thank you said she doesn't need call  Back.Hillery Hunter

## 2016-07-02 NOTE — Telephone Encounter (Signed)
Spoke with Dorena Cookey with [employer], who states she will get appeal sent to her pharmacist for determination. Will await response.

## 2016-07-03 NOTE — Telephone Encounter (Signed)
Dorena Cookey called back and she stated that the OFEV has been approved for the pt.  They will get the approval letters sent out to our office and the pt.    Called and LMOM to make Jamie from [employer] aware that the ofev has been approved.

## 2016-07-05 ENCOUNTER — Telehealth: Payer: Self-pay | Admitting: Pulmonary Disease

## 2016-07-06 NOTE — Telephone Encounter (Signed)
Spoke with the patient and his wife this morning. They were concerned regarding the cost of Ofev after receiving a letter of denial. Wife informs me the patient did receive a letter stating that that previous decision had been "overturned". Patient reports he has taken a couple doses of the medication without adverse effect. I instructed them to contact me if he had any further questions or concerns.

## 2016-07-19 ENCOUNTER — Other Ambulatory Visit: Payer: Self-pay

## 2016-07-19 DIAGNOSIS — J189 Pneumonia, unspecified organism: Secondary | ICD-10-CM

## 2016-07-20 NOTE — Telephone Encounter (Addendum)
Spoke with patient and wife who stated they have been instructed by Patient [employer] in [place] to send paperwork (tax information and IRA forms) to help take care of payment with the [employer]. Will continue to follow.

## 2016-07-23 ENCOUNTER — Telehealth: Payer: Self-pay | Admitting: Pulmonary Disease

## 2016-07-23 NOTE — Telephone Encounter (Signed)
Called and spoke to pt's wife, Casey [name]. [name] states the pt has been experiencing a bit more anxiety than normal - I.e. requesting his wife accompany him when normally he would travel alone or stating his feels more anxious. [name] questioning if this is a normal side effect of Ofev. Advised her that this is not typically seen as a side effect. Pt denies any change in breathing status nor chest pain/tightness. Casey [name] is requesting this message be sent to [facility] to advise on recs if necessary.   JN please advise. Thanks.

## 2016-07-24 NOTE — Telephone Encounter (Signed)
I have never encountered this. It has rarely been documented that anxiety can occur in the couple of week after first starting but this is rare. They should let me know if this gets worse or persists. Thanks.

## 2016-07-25 NOTE — Telephone Encounter (Signed)
lmtcb x1 for pt's wife. 

## 2016-07-26 NOTE — Telephone Encounter (Signed)
A call was placed to inform patient LFTs were needed and to get update on OFEV; the house phone number continued to ring with no voicemail option. Pt wife cell was called as an attempt to contact patient. She stated they were at a game and asked if I could call back at 6pm. I informed her the office will be closed by then, but I will contact her tomorrow.

## 2016-07-26 NOTE — Telephone Encounter (Signed)
Spoke with pt's wife, Danton Clap. She is aware of JN's response. Nothing further was needed.

## 2016-07-27 NOTE — Telephone Encounter (Addendum)
Spoke with a patient and wife who stated pt has been approved for the assistance program and is receiving another bottle. They were also informed a LFT needed to be collected now and every four from here on out. Pt wife stated pt will come on 2022/09/05 to have lab collected. Order for lab was placed. Pt was also interested in passing contact information on to [place] Educator Marcheta Grammes. Contact information will be passed on 2022-09-05.

## 2016-07-27 NOTE — Addendum Note (Signed)
Addended by: Tyson Dense on: 07/27/2016 05:11 PM   Modules accepted: Orders

## 2016-07-30 ENCOUNTER — Other Ambulatory Visit (INDEPENDENT_AMBULATORY_CARE_PROVIDER_SITE_OTHER): Payer: Medicare Other

## 2016-07-30 DIAGNOSIS — J849 Interstitial pulmonary disease, unspecified: Secondary | ICD-10-CM | POA: Diagnosis not present

## 2016-07-30 LAB — HEPATIC FUNCTION PANEL
ALK PHOS: 75 U/L (ref 39–117)
ALT: 15 U/L (ref 0–53)
AST: 16 U/L (ref 0–37)
Albumin: 3.4 g/dL — ABNORMAL LOW (ref 3.5–5.2)
BILIRUBIN DIRECT: 0.1 mg/dL (ref 0.0–0.3)
TOTAL PROTEIN: 6.8 g/dL (ref 6.0–8.3)
Total Bilirubin: 0.6 mg/dL (ref 0.2–1.2)

## 2016-07-31 NOTE — Telephone Encounter (Signed)
Silvio Clayman, BSN OFEV clinical educator was contacted. She was informed the patient was interested in receiving OFEV education. She was given patient's name and contact information. Nothing further is needed.

## 2016-08-02 DIAGNOSIS — E782 Mixed hyperlipidemia: Secondary | ICD-10-CM | POA: Diagnosis not present

## 2016-08-02 DIAGNOSIS — I10 Essential (primary) hypertension: Secondary | ICD-10-CM | POA: Diagnosis not present

## 2016-08-02 DIAGNOSIS — J8410 Pulmonary fibrosis, unspecified: Secondary | ICD-10-CM | POA: Diagnosis not present

## 2016-08-02 DIAGNOSIS — Z79899 Other long term (current) drug therapy: Secondary | ICD-10-CM | POA: Diagnosis not present

## 2016-09-03 ENCOUNTER — Telehealth: Payer: Self-pay | Admitting: Pulmonary Disease

## 2016-09-03 DIAGNOSIS — J849 Interstitial pulmonary disease, unspecified: Secondary | ICD-10-CM

## 2016-09-03 NOTE — Telephone Encounter (Signed)
Spoke with patient and informed him it was time to have LFT collected while on OFEV. Pt was instructed to come in anytime during this week and head to basement to have lab collected. Pt verbalized understanding. Order placed for LFT. Nothing further is needed.

## 2016-09-04 ENCOUNTER — Other Ambulatory Visit (INDEPENDENT_AMBULATORY_CARE_PROVIDER_SITE_OTHER): Payer: Medicare Other

## 2016-09-04 DIAGNOSIS — J849 Interstitial pulmonary disease, unspecified: Secondary | ICD-10-CM

## 2016-09-04 LAB — HEPATIC FUNCTION PANEL
ALT: 15 U/L (ref 0–53)
AST: 17 U/L (ref 0–37)
Albumin: 3.3 g/dL — ABNORMAL LOW (ref 3.5–5.2)
Alkaline Phosphatase: 69 U/L (ref 39–117)
BILIRUBIN DIRECT: 0.1 mg/dL (ref 0.0–0.3)
TOTAL PROTEIN: 6.6 g/dL (ref 6.0–8.3)
Total Bilirubin: 0.4 mg/dL (ref 0.2–1.2)

## 2016-09-24 ENCOUNTER — Ambulatory Visit (INDEPENDENT_AMBULATORY_CARE_PROVIDER_SITE_OTHER): Payer: Medicare Other | Admitting: Pulmonary Disease

## 2016-09-24 ENCOUNTER — Encounter: Payer: Self-pay | Admitting: Pulmonary Disease

## 2016-09-24 ENCOUNTER — Ambulatory Visit (INDEPENDENT_AMBULATORY_CARE_PROVIDER_SITE_OTHER): Payer: Medicare Other | Admitting: *Deleted

## 2016-09-24 VITALS — BP 110/80 | HR 73 | Ht 67.5 in | Wt 142.0 lb

## 2016-09-24 DIAGNOSIS — J9611 Chronic respiratory failure with hypoxia: Secondary | ICD-10-CM

## 2016-09-24 DIAGNOSIS — J84112 Idiopathic pulmonary fibrosis: Secondary | ICD-10-CM

## 2016-09-24 DIAGNOSIS — K219 Gastro-esophageal reflux disease without esophagitis: Secondary | ICD-10-CM | POA: Diagnosis not present

## 2016-09-24 DIAGNOSIS — J849 Interstitial pulmonary disease, unspecified: Secondary | ICD-10-CM

## 2016-09-24 LAB — PULMONARY FUNCTION TEST
DL/VA % PRED: 36 %
DL/VA: 1.61 ml/min/mmHg/L
DLCO COR % PRED: 20 %
DLCO COR: 6 ml/min/mmHg
DLCO UNC % PRED: 20 %
DLCO unc: 6 ml/min/mmHg
FEF 25-75 Pre: 3.81 L/sec
FEF2575-%Pred-Pre: 260 %
FEV1-%Pred-Pre: 110 %
FEV1-Pre: 2.56 L
FEV1FVC-%Pred-Pre: 121 %
FEV6-%Pred-Pre: 96 %
FEV6-Pre: 2.98 L
FEV6FVC-%Pred-Pre: 108 %
FVC-%Pred-Pre: 89 %
FVC-Pre: 3.02 L
PRE FEV6/FVC RATIO: 100 %
Pre FEV1/FVC ratio: 85 %

## 2016-09-24 NOTE — Progress Notes (Signed)
Subjective:    Patient ID: Casey [name], male    DOB: 1928-06-28, 83 y.o.   MRN: [id number]  C.C.:  Follow-up for ILD/IPF, Chronic Hypoxic Respiratory Failure, GERD, & Mediastinal Adenopathy.  HPI ILD/IPF: Started on Ofev since last appointment. Only symptom was anxiety reported. This passed. He feels this has passed. He does feel stressed when he thinks about the disease. He does have some mild diarrhea that only requires intermittent medication. He reports his dyspnea is at baseline. He denies any wheezing. Continuing to cough, intermittently that is largely nonproductive.   Chronic hypoxic respiratory failure: Previously has demonstrated up to 3 L/m oxygen requirement with exertion. At last appointment patient was only requiring 2 L/m. Again demonstrated a 3 L per minute oxygen requirement with exertion today.  GERD: Prescribed Protonix. No reflux, dyspepsia, or morning brash water taste.   Mediastinal adenopathy: First noted April 2017 on CT imaging. No significant change on CT imaging in October 2017. Holding on further imaging pending new symptoms.    Review of Systems No chest pain or pressure. No fever or chills. He denies any rashes or bruising.   No Known Allergies  Current Outpatient Prescriptions on File Prior to Visit  Medication Sig Dispense Refill  . aspirin EC 81 MG tablet Take 1 tablet by mouth daily.    Marland Kitchen atorvastatin (LIPITOR) 20 MG tablet Take 1 tablet by mouth daily.    . Cholecalciferol (VITAMIN D3) 2000 units TABS Take by mouth daily.    . cloNIDine (CATAPRES) 0.1 MG tablet Take 1 tablet by mouth at bedtime.    . Coenzyme Q10 (CO Q-10) 100 MG CAPS Take by mouth daily.    . Dorzolamide HCl-Timolol Mal (COSOPT OP) daily.    . Enalapril-Hydrochlorothiazide 5-12.5 MG tablet Take 1 tablet by mouth every morning.    . latanoprost (XALATAN) 0.005 % ophthalmic solution 2 (two) times daily.    Marland Kitchen LUMIGAN 0.01 % SOLN daily.  10  . pantoprazole (PROTONIX) 40 MG tablet  Take 1 tablet by mouth daily.     No current facility-administered medications on file prior to visit.     Past Medical History:  Diagnosis Date  . Carotid artery stenosis   . GERD (gastroesophageal reflux disease)   . Glaucoma   . Hyperlipemia   . Hypertension     Past Surgical History:  Procedure Laterality Date  . CAROTID ENDARTERECTOMY    . CHOLECYSTECTOMY    . hernia surgery    . right ankle replacement    . right leg surgery     x 2  . TONSILLECTOMY      Family History  Problem Relation Age of Onset  . Heart disease Father   . Heart disease Brother   . Diabetes Brother   . Prostate cancer Brother   . Heart disease Sister   . Diabetes Sister   . Lung disease Neg Hx   . Rheumatologic disease Neg Hx   . Heart disease Other     Social History   Social History  . Marital status: Married    Spouse name: N/A  . Number of children: N/A  . Years of education: N/A   Occupational History  . Retired     Customer service manager   Social History Main Topics  . Smoking status: Never Smoker  . Smokeless tobacco: Never Used  . Alcohol use 0.0 oz/week     Comment: social  . Drug use: No  . Sexual activity: Not Asked  Other Topics Concern  . None   Social History Narrative   Originally from [place]. Has lived in [place], [place], [place], [place]. Remote travel to [place], [place], & [place]. Previously worked Education administrator. Currently has a dog. No bird, mold, asbestos, or hot tub exposure. Patient does have a pool. Enjoys doing yard work. Remote exposure to silage and hay. Also worked with cattle.       Objective:   Physical Exam BP 110/80 (BP Location: Right Arm, Patient Position: Sitting, Cuff Size: Normal)   Pulse 73   Ht 5' 7.5" (1.715 m)   Wt 142 lb (64.4 kg)   SpO2 92%   BMI 21.91 kg/m   Gen.: Elderly male. No distress. Wife with patient today. Integument: No rash on exposed skin. Warm. Dry. HEENT: Moist membranes. No scleral icterus. No scleral injection. Cardiovascular:  Regular rate. Regular rhythm. Trace lower extremity edema. Pulmonary: Bilateral basilar crackles. Normal work of breathing on room air. Musculoskeletal: No joint effusion or deformity. Normal bowel & tone.   PFT 09/24/16: FVC 3.02 L (89%) FEV1 2.56 L (110%) FEV1/FVC 0.85 FEF 25-75 3.81 L (260%) DLCO corrected 20% 06/19/16: FVC 3.02 L (89%) FEV1 2.48 L (107%) FEV1/FVC 0.82 FEF 25-75 3.26 L (221%) DLCO corrected 27% 04/23/16: FVC 2.70 L (82%) FEV1 2.38 L (102%) FEV1/FVC 0.86 FEF 25-75 2.98 L (201%) DLCO corrected 31% (hgb 14.6)  6MWT 09/24/16:  Walked 264 meters / Baseline Sat 96% on RA / Nadir Sat 85% on RA @ 4:08 (required 3 L/m to maintain with exertion) 06/19/16:  Walked 336 meters / Baseline Sat 93% on RA / Nadir Sat 83% on RA @ end of test (required 2 L/m to maintain with repeat walk) 04/23/16:  Walked 354 meters / Baseline Sat 96% on RA / Nadir Sat 83% on RA @ end of test (required 3 L/m to maintain with repeat walk) 10/21/15:  Walked 324 meters / Baseline Sat 95% on RA / Nadir Sat 88% on RA @ end of test  IMAGING CT CHEST W/O 02/27/16 (previously reviewed by me):  Bilateral intralobular septal thickening and fibrotic changes with traction bronchiectasis relatively unchanged when compared with previous CT imaging. Mediastinal adenopathy persists with largest lymph node is subcarinal measuring approximately 1.6 cm in short axis. No pleural effusion or thickening. No pericardial effusion. No developing nodule or mass.  HRCT CHEST W/O 09/27/15 (previously reviewed by me):No pericardial effusion. No pleural effusion or thickening. Traction bronchiectasis with subpleural and primary bronchovascular reticular changes as well as groundglass. Involvement of both apical and basilar regions. Early honeycomb changes. Small to moderate hiatal hernia. Calcifications noted within the liver and spleen. No axillary adenopathy. Enlarged mediastinal lymph nodes measuring up to 1.8 cm in short axis present.  CT  CHEST/ABD/PELVIS W/ 08/19/15 (previously reviewed by me): Mediastinal lymphadenopathy noted with the largest lymph node measuring 1.6 cm at level 7, 1.3 cm 4L, & 1.1 cm 4R positions. Calcifications noted within the spleen and liver suggestive of prior granulomatous disease. No pleural effusion or thickening. Patchy bilateral intralobular septal thickening and reticulation consistent with interstitial lung disease. This appears both centrally in the lungs as well as peripherally occurring throughout all fields with slightly less involvement in the upper lung zones. Suggestion of traction bronchiectasis. No obvious groundglass opacities. There is also suggestion of honeycomb changes. Remote T-8 compression fracture with advanced anterior height loss. Mild finding enlargement of left inguinal canal. Renal cysts on the left noted with mild renal atrophy and no hydronephrosis. Enlarged central  prostate that is symmetric. Colonic diverticulosis noted. No appendicitis. No mass or adenopathy. No ascites or pneumoperitoneum.  PORT CXR 02/01/09 (previously reviewed by me): Heart appears to be largely normal in size and unchanged when compared with previous portable chest x-ray from 2009. Mediastinum normal in contour. No obvious parenchymal opacities or increased interstitial markings. No pleural effusion.  LABS Hepatic Function Latest Ref Rng & Units 09/04/2016 07/30/2016 06/19/2016  Total Protein 6.0 - 8.3 g/dL 6.6 6.8 7.4  Albumin 3.5 - 5.2 g/dL 3.3(L) 3.4(L) 3.8  AST 0 - 37 U/L _0 ALT 0 - 53 U/L _1 Alk Phosphatase 39 - 117 U/L 69 75 71  Total Bilirubin 0.2 - 1.2 mg/dL 0.4 0.6 0.6  Bilirubin, Direct 0.0 - 0.3 mg/dL 0.1 0.1 0.1    10/21/15 Anti-CCP:  <16 RF:  <10 Chromatin antibody:  <0.2 Centromere antibody screen:  <1.0 Double-stranded DNA antibody: 1 ENA Smith antibody:  <1 SSA:   <1.0 SSB:   <1.0 SCL 70:   <1.0  09/22/15 ProBNP: 97.0 CRP: 0.5 ESR: 16 ANA: Negative Hypersensitivity  Pneumonitis Panel: Negative   08/15/15 CBC: 9.1/14.2/43.2/239 BMP: 1:30/3.9/105/24/20/1.43/98/9.0 LFT: 3.8/6.7/0.4/73/19/20 ESR: 11 CRP: 5.5    Assessment & Plan:  83 y.o. male with ILD/IPF, chronic hypoxic respiratory failure, & GERD. Patient's spirometry today remains stable and his oxygen requirement remains relatively unchanged. His walk test distance has decreased significantly but this is of unclear significance given his ILD disease stability otherwise. We did discuss his disease process at length as well as the need for oxygen therapy. I believe he may benefit from a portable concentrator which she is considering. His serum hepatic function panel has remained stable on drug therapy and he has no adverse effects from his Ofev treatment. I instructed the patient contact my office if he had any new breathing problems or questions before his next appointment. Also discussed his weight loss which is of unclear significance and without additional symptoms I do not feel that further testing is necessary and will defer to his PCP in managing.  1. ILD/IPF: Continuing Ofev. Continuing to monitor hepatic function panel monthly at his primary care physician's office. They will fax this result to my office for my review. Repeating spirometry with DLCO and 6 minute walk test with oxygen titration at next appointment. 2. Chronic hypoxic respiratory failure: Again recommended oxygen therapy. Repeat 6 minute walk test with oxygen titration at next appointment. Patient contemplating portable oxygen concentrator. 3. GERD: Well-controlled with Protonix. No changes. 4. Health maintenance: Managed by his PCP. Previously reported influenza, Pneumovax, and Prevnar vaccination. 5. Follow-up: Return to clinic in 3 months or sooner if needed.  Sonia Baller Ashok Cordia, M.D. [facility] Pager:  [phone] After 3pm or if no response, call [phone] 3:44 PM 09/24/16

## 2016-09-24 NOTE — Progress Notes (Signed)
PFT done today. 

## 2016-09-24 NOTE — Progress Notes (Signed)
SIX MIN WALK 09/24/2016 06/19/2016 04/23/2016 10/21/2015  Medications @8am --aspirin, lipitor,vit D3, lumigan,xalatan, catapress, Cq10, enalapril/HCTZ lipitor 20mg , CoQ10 100mg , Enalapril-HCTZ 5/12.5mg  --- ALL at 7am ASA 81, Lipitor 20mg , Lumigan .01%, Vit D3 2000IU, COQ10 100mg , Enalapril-HCTZ 5/12.5mg , Xalatan .005% ---ALL at 8am Lipitor, Vitamin D3, CoQ10, Enalapril  Supplimental Oxygen during Test? (L/min) Yes No No No  O2 Flow Rate 3 - - -  Type Continuous - - -  Laps 5 7 7 6   Partial Lap (in Meters) 24 0 18 36  Baseline BP (sitting) 144/86 162/78 142/80 158/96  Baseline Heartrate 76 64 56 76  Baseline Dyspnea (Borg Scale) 0 0 0 0  Baseline Fatigue (Borg Scale) 0 0 0 0  Baseline SPO2 96 93 96 95  BP (sitting) 140/80 160/62 162/84 168/98  Heartrate 71 74 73 110  Dyspnea (Borg Scale) 1 1 1  0  Fatigue (Borg Scale) 0 0.5 0.5 0  SPO2 95 83 83 88  BP (sitting) 128/70 158/80 130/74 150/84  Heartrate 62 58 70 84  SPO2 99 92 90 94  Stopped or Paused before Six Minutes Yes No No No  Other Symptoms at end of Exercise stopped the clock at 4:08 due to oxygen levels at 85% room air.  placed on 2 liters via [place], pt up to 97%.  stopped the clock at 1;43 due to 88% on 2 liters, increased to 3 liters and pt completed the test without difficulty.  pt stated that he does not wear any oxygen during the day or at night.  pt did not feel winded during the test.  - - -  Distance Completed 264 336 354 324  Tech Comments: pt did well during test.  did not feel tired, SOB during the test, even though his sats did drop down to 85% on room air.   Pt completed 6MW test and did desat, pt was titrated on O2 for an additional 6 mins >> O2 maintained above 90% on 2 liters O2. (see patient care coordination notes)--amg pt walked at a normal pace(per pt)- desat to 83% which patient recovered to 90% after 2 minute resting period. pt rested for a total of 10 minutes and then was titrated on O2 for 6 minutes with the nurse. Pt  started walk at 98% room air and desated to 84% by the end of 1 lab around the office. pt was walked for a total of 6 minutes and O2 was titrated up to 3 Liters ending test at 90%. See patient care coordination notes for O2 saturations. --amg -

## 2016-09-24 NOTE — Patient Instructions (Addendum)
   Continue taking your medications as prescribed.  Call me if you have any new breathing problems before your next appointment or questions.  We will have your monthly blood work done at your primary care physician's office. They can just fax me the results.  Checking with your primary care physician about placing you on a medication to stimulate your appetite like Megace.  Let me know if you want to get set up with a portable oxygen concentrator to use when you are traveling or doing strenuous activity.   I will see you back in 3 months with a breathing & walking test, or sooner if needed.  TESTS ORDERED: 1. Spirometry with DLCO at next appointment 2. 6MWT with oxygen titration at next appointment

## 2016-10-02 ENCOUNTER — Telehealth: Payer: Self-pay | Admitting: Pulmonary Disease

## 2016-10-02 DIAGNOSIS — J9610 Chronic respiratory failure, unspecified whether with hypoxia or hypercapnia: Secondary | ICD-10-CM

## 2016-10-02 NOTE — Telephone Encounter (Signed)
Pt has decided that he would like to be set up with POC at home.  Pt stated that this was discussed at his last ov and would like the order to be placed so this can be set up for him.  JN please advise. thanks

## 2016-10-05 NOTE — Telephone Encounter (Signed)
JN please advise. Thanks. 

## 2016-10-09 NOTE — Telephone Encounter (Signed)
Go ahead and place an order for home oxygen and a portable concentrator. He required 3 L/m continuous flow to maintain his saturation during his last walk so we should go with a company that has a POC that can go at least to 5 L/m & preferably 6 L/m. Thanks.

## 2016-10-09 NOTE — Telephone Encounter (Signed)
I have attempted to call the pt but there is no VM--it just states that your party is not answering.  Will try back.

## 2016-10-10 NOTE — Telephone Encounter (Signed)
Spoke with the pt and notified of recs per JN  Order sent to [facility]  Nothing further needed per pt

## 2016-10-11 ENCOUNTER — Telehealth: Payer: Self-pay | Admitting: Pulmonary Disease

## 2016-10-11 NOTE — Telephone Encounter (Signed)
snt aps a message to see if they can look on snap shot page to see the 3part sats our printer is down and I can not print this Joellen [place]

## 2016-10-12 DIAGNOSIS — F419 Anxiety disorder, unspecified: Secondary | ICD-10-CM | POA: Diagnosis not present

## 2016-10-12 DIAGNOSIS — Z6821 Body mass index (BMI) 21.0-21.9, adult: Secondary | ICD-10-CM | POA: Diagnosis not present

## 2016-10-12 NOTE — Telephone Encounter (Signed)
Printed and faxed info that was needed to [facility]

## 2016-10-15 DIAGNOSIS — H401133 Primary open-angle glaucoma, bilateral, severe stage: Secondary | ICD-10-CM | POA: Diagnosis not present

## 2016-10-17 DIAGNOSIS — Z760 Encounter for issue of repeat prescription: Secondary | ICD-10-CM | POA: Diagnosis not present

## 2016-10-17 DIAGNOSIS — E782 Mixed hyperlipidemia: Secondary | ICD-10-CM | POA: Diagnosis not present

## 2016-11-02 DIAGNOSIS — J8410 Pulmonary fibrosis, unspecified: Secondary | ICD-10-CM | POA: Diagnosis not present

## 2016-11-02 DIAGNOSIS — I10 Essential (primary) hypertension: Secondary | ICD-10-CM | POA: Diagnosis not present

## 2016-11-02 DIAGNOSIS — Z6821 Body mass index (BMI) 21.0-21.9, adult: Secondary | ICD-10-CM | POA: Diagnosis not present

## 2016-11-02 DIAGNOSIS — F419 Anxiety disorder, unspecified: Secondary | ICD-10-CM | POA: Diagnosis not present

## 2016-11-06 ENCOUNTER — Telehealth: Payer: Self-pay | Admitting: Pulmonary Disease

## 2016-11-06 NOTE — Telephone Encounter (Signed)
LABS 10/17/16 CBC: 10.7/13.9/41.8/236 BMP: 135/4.3/104/22/19/1.19/83/8.5 LFT: 3.4/6.3/0.6/61/19/18

## 2016-11-12 ENCOUNTER — Ambulatory Visit (INDEPENDENT_AMBULATORY_CARE_PROVIDER_SITE_OTHER): Payer: Medicare Other | Admitting: *Deleted

## 2016-11-12 ENCOUNTER — Telehealth: Payer: Self-pay | Admitting: Pulmonary Disease

## 2016-11-12 DIAGNOSIS — J849 Interstitial pulmonary disease, unspecified: Secondary | ICD-10-CM

## 2016-11-12 DIAGNOSIS — J84112 Idiopathic pulmonary fibrosis: Secondary | ICD-10-CM

## 2016-11-12 NOTE — Telephone Encounter (Signed)
Ok to schedule 6 MWT on 4L O2 ith his POC to see what is happening. Results to Dr Ashok Cordia

## 2016-11-12 NOTE — Telephone Encounter (Signed)
Spoke with pt's wife and they are able to come in today this afternoon for the pt to do the walk.   Forwarding to JN to assure he is aware of what is going on

## 2016-11-12 NOTE — Telephone Encounter (Addendum)
CY  Please Advise-  This is a JN pt, Pt's wife called in and stated that husband has been having really bad anxiety and she states lately pt has been expressing feeling he is not getting enough oxygen with his POC when he is up and moving around. She states pt stats when he checks when his on room air is 93%. She states lately he has his POC on 4L but unsure of what his stats are when he is walking. They are unsure if they should reach out to PCP to discuss anxiety medication or him come in to re-check his O2 level on his POC.     Versions: 1. Javier Glazier, MD (Physician) at 09/24/2016 4:01 PM - Signed     Continue taking your medications as prescribed.  Call me if you have any new breathing problems before your next appointment or questions.  We will have your monthly blood work done at your primary care physician's office. They can just fax me the results.  Checking with your primary care physician about placing you on a medication to stimulate your appetite like Megace.  Let me know if you want to get set up with a portable oxygen concentrator to use when you are traveling or doing strenuous activity.   I will see you back in 3 months with a breathing & walking test, or sooner if needed.  TESTS ORDERED: 1. Spirometry with DLCO at next appointment 2. 6MWT with oxygen titration at next appointment

## 2016-11-12 NOTE — Progress Notes (Signed)
   SIX MIN WALK 11/12/2016 09/24/2016 06/19/2016 04/23/2016 10/21/2015  Medications atorvastatin 20mg , enalapril 5-12.5mg ,ofev 150mg  @8am --aspirin, lipitor,vit D3, lumigan,xalatan, catapress, Cq10, enalapril/HCTZ lipitor 20mg , CoQ10 100mg , Enalapril-HCTZ 5/12.5mg  --- ALL at 7am ASA 81, Lipitor 20mg , Lumigan .01%, Vit D3 2000IU, COQ10 100mg , Enalapril-HCTZ 5/12.5mg , Xalatan .005% ---ALL at 8am Lipitor, Vitamin D3, CoQ10, Enalapril  Supplimental Oxygen during Test? (L/min) Yes Yes No No No  O2 Flow Rate 4 3 - - -  Type - Continuous - - -  Laps 5 5 7 7 6   Partial Lap (in Meters) 0 24 0 18 36  Baseline BP (sitting) 132/78 144/86 162/78 142/80 158/96  Baseline Heartrate 75 76 64 56 76  Baseline Dyspnea (Borg Scale) 0 0 0 0 0  Baseline Fatigue (Borg Scale) 0 0 0 0 0  Baseline SPO2 97 96 93 96 95  BP (sitting) 138/82 140/80 160/62 162/84 168/98  Heartrate 146 71 74 73 110  Dyspnea (Borg Scale) 3 1 1 1  0  Fatigue (Borg Scale) 2 0 0.5 0.5 0  SPO2 92 95 83 83 88  BP (sitting) 134/74 128/70 158/80 130/74 150/84  Heartrate 68 62 58 70 84  SPO2 100 99 92 90 94  Stopped or Paused before Six Minutes No Yes No No No  Other Symptoms at end of Exercise - stopped the clock at 4:08 due to oxygen levels at 85% room air.  placed on 2 liters via [place], pt up to 97%.  stopped the clock at 1;43 due to 88% on 2 liters, increased to 3 liters and pt completed the test without difficulty.  pt stated that he does not wear any oxygen during the day or at night.  pt did not feel winded during the test.  - - -  Distance Completed 240 264 336 354 324  Tech Comments: Pt was walked a slow steady pace, nurse noticed while montioring pt during walk heart rate bouncing up and down from 140's and lower, Pt c/o dyspnea during and after walk. He was able to complete his whole walk and seemed to tolerate the walk well  pt did well during test.  did not feel tired, SOB during the test, even though his sats did drop down to 85% on room air.   Pt  completed 6MW test and did desat, pt was titrated on O2 for an additional 6 mins >> O2 maintained above 90% on 2 liters O2. (see patient care coordination notes)--amg pt walked at a normal pace(per pt)- desat to 83% which patient recovered to 90% after 2 minute resting period. pt rested for a total of 10 minutes and then was titrated on O2 for 6 minutes with the nurse. Pt started walk at 98% room air and desated to 84% by the end of 1 lab around the office. pt was walked for a total of 6 minutes and O2 was titrated up to 3 Liters ending test at 90%. See patient care coordination notes for O2 saturations. --amg -

## 2016-11-13 NOTE — Telephone Encounter (Signed)
Routing to MW in error.  Routing to [employer] as FYI.

## 2016-11-13 NOTE — Telephone Encounter (Signed)
Not sure why this is in my stack

## 2016-11-15 NOTE — Telephone Encounter (Signed)
Spoke with patient's wife [place]. Explained to her JN's recs. She verbalized understanding. Stated that she will contact his PCP ASAP for a follow up.   Nothing else needed at time of call.

## 2016-11-15 NOTE — Telephone Encounter (Signed)
Patient's saturation did well during his walk. But his heart rate increased dramatically - up to 140bpm at most. It may be that he is having some heart rhythm problems with exertion that could be causing his symptoms. He should contact his PCP. He may need to be seen & have an EKG done or Holter monitor to check out his heart.  J.

## 2016-11-19 DIAGNOSIS — F419 Anxiety disorder, unspecified: Secondary | ICD-10-CM | POA: Diagnosis not present

## 2016-11-19 DIAGNOSIS — Z9181 History of falling: Secondary | ICD-10-CM | POA: Diagnosis not present

## 2016-11-19 DIAGNOSIS — Z6821 Body mass index (BMI) 21.0-21.9, adult: Secondary | ICD-10-CM | POA: Diagnosis not present

## 2016-11-19 DIAGNOSIS — Z1389 Encounter for screening for other disorder: Secondary | ICD-10-CM | POA: Diagnosis not present

## 2016-12-10 DIAGNOSIS — Z0000 Encounter for general adult medical examination without abnormal findings: Secondary | ICD-10-CM | POA: Diagnosis not present

## 2016-12-10 DIAGNOSIS — I10 Essential (primary) hypertension: Secondary | ICD-10-CM | POA: Diagnosis not present

## 2016-12-10 DIAGNOSIS — J8410 Pulmonary fibrosis, unspecified: Secondary | ICD-10-CM | POA: Diagnosis not present

## 2016-12-10 DIAGNOSIS — Z79899 Other long term (current) drug therapy: Secondary | ICD-10-CM | POA: Diagnosis not present

## 2016-12-10 DIAGNOSIS — M810 Age-related osteoporosis without current pathological fracture: Secondary | ICD-10-CM | POA: Diagnosis not present

## 2016-12-11 DIAGNOSIS — Z961 Presence of intraocular lens: Secondary | ICD-10-CM | POA: Diagnosis not present

## 2016-12-11 DIAGNOSIS — H52223 Regular astigmatism, bilateral: Secondary | ICD-10-CM | POA: Diagnosis not present

## 2016-12-11 DIAGNOSIS — H26492 Other secondary cataract, left eye: Secondary | ICD-10-CM | POA: Diagnosis not present

## 2016-12-11 DIAGNOSIS — H26491 Other secondary cataract, right eye: Secondary | ICD-10-CM | POA: Diagnosis not present

## 2016-12-11 DIAGNOSIS — H47233 Glaucomatous optic atrophy, bilateral: Secondary | ICD-10-CM | POA: Diagnosis not present

## 2016-12-11 DIAGNOSIS — H5203 Hypermetropia, bilateral: Secondary | ICD-10-CM | POA: Diagnosis not present

## 2016-12-11 DIAGNOSIS — H524 Presbyopia: Secondary | ICD-10-CM | POA: Diagnosis not present

## 2016-12-11 DIAGNOSIS — Z1211 Encounter for screening for malignant neoplasm of colon: Secondary | ICD-10-CM | POA: Diagnosis not present

## 2016-12-11 DIAGNOSIS — H401133 Primary open-angle glaucoma, bilateral, severe stage: Secondary | ICD-10-CM | POA: Diagnosis not present

## 2016-12-20 DIAGNOSIS — M810 Age-related osteoporosis without current pathological fracture: Secondary | ICD-10-CM | POA: Diagnosis not present

## 2016-12-25 ENCOUNTER — Telehealth: Payer: Self-pay | Admitting: Pulmonary Disease

## 2016-12-25 ENCOUNTER — Ambulatory Visit (INDEPENDENT_AMBULATORY_CARE_PROVIDER_SITE_OTHER): Payer: Medicare Other | Admitting: Pulmonary Disease

## 2016-12-25 DIAGNOSIS — J84112 Idiopathic pulmonary fibrosis: Secondary | ICD-10-CM

## 2016-12-25 LAB — PULMONARY FUNCTION TEST
DL/VA % PRED: 41 %
DL/VA: 1.82 ml/min/mmHg/L
DLCO COR % PRED: 19 %
DLCO cor: 5.76 ml/min/mmHg
DLCO unc % pred: 19 %
DLCO unc: 5.57 ml/min/mmHg
FEF 25-75 Pre: 2.6 L/sec
FEF2575-%PRED-PRE: 180 %
FEV1-%Pred-Pre: 100 %
FEV1-PRE: 2.31 L
FEV1FVC-%Pred-Pre: 121 %
FEV6-%PRED-PRE: 88 %
FEV6-Pre: 2.71 L
FEV6FVC-%Pred-Pre: 108 %
FVC-%PRED-PRE: 81 %
FVC-PRE: 2.72 L
Pre FEV1/FVC ratio: 85 %
Pre FEV6/FVC Ratio: 100 %

## 2016-12-25 NOTE — Progress Notes (Signed)
PFT done today. 

## 2016-12-25 NOTE — Telephone Encounter (Signed)
Called and spoke to pt. Pt states he is currently taking an anti-anxiety med but is unsure of the name. Pt is questioning if it is good for anxiety. Pt is unsure of the name and will have his wife call us back when she gets off the phone with another call. Will await call back.

## 2016-12-26 NOTE — Telephone Encounter (Signed)
lmtcb x1 for pt. 

## 2016-12-26 NOTE — Telephone Encounter (Signed)
Patient wants to know if his clonazepam 0.5mg  medication is a good medication for his anxiety. This medication was not prescribed by JN, nor is it listed on his current med list or the listed of reconciled medications. States he just takes 1 tablet per day.    JN, please advise. Thanks!

## 2016-12-26 NOTE — Telephone Encounter (Signed)
Patient's wife, Danton Clap, returned call.  Name of medication is clonazepam 0.5 mg 1 per day.  CB if questions, [phone] or cell [phone].

## 2016-12-26 NOTE — Telephone Encounter (Signed)
Contacted patient. I instructed him that he could try taking half of the Klonopin tablet (0.25 mg) to see if this had less side effects. Wife reports the patient is "goofy" first thing in the morning after taking the medication but this then abates by the afternoon and early evening. I did advise them that this medication can be mind altering and also increase his somnolence. I instructed her that he should not be driving while on this medication and under its effect. He does report some anxiety in the evening but this is not a consistent thing. I instructed him that if taking 0.25 mg in the morning is sufficient to help with his anxiety that he could touch base with his primary care physician and see if he could simply take the other half tablet in the evening if needed. His wife is concerned because he is less active and seems to be less mobile. The patient himself reports that he does have some periods of dyspnea during the day but this is not consistent. We briefly reviewed his spirometry which does show decline since previous testing and may but he is scheduled for a walk test the day of his next visit with me. I instructed them to contact me if he had any further questions or concerns before then.  12/25/16: FVC 2.72 L (81%) FEV1 2.31 L (100%) FEV/FVC 0.85 FEF 25-75 2.60 L (180%) DLCO corrected 19%

## 2017-01-07 ENCOUNTER — Other Ambulatory Visit (INDEPENDENT_AMBULATORY_CARE_PROVIDER_SITE_OTHER): Payer: Medicare Other

## 2017-01-07 ENCOUNTER — Ambulatory Visit (INDEPENDENT_AMBULATORY_CARE_PROVIDER_SITE_OTHER): Payer: Medicare Other | Admitting: *Deleted

## 2017-01-07 ENCOUNTER — Encounter: Payer: Self-pay | Admitting: Pulmonary Disease

## 2017-01-07 ENCOUNTER — Ambulatory Visit (INDEPENDENT_AMBULATORY_CARE_PROVIDER_SITE_OTHER): Payer: Medicare Other | Admitting: Pulmonary Disease

## 2017-01-07 VITALS — BP 118/78 | HR 62 | Ht 69.0 in | Wt 141.0 lb

## 2017-01-07 DIAGNOSIS — K219 Gastro-esophageal reflux disease without esophagitis: Secondary | ICD-10-CM

## 2017-01-07 DIAGNOSIS — J84112 Idiopathic pulmonary fibrosis: Secondary | ICD-10-CM | POA: Diagnosis not present

## 2017-01-07 DIAGNOSIS — J849 Interstitial pulmonary disease, unspecified: Secondary | ICD-10-CM | POA: Diagnosis not present

## 2017-01-07 DIAGNOSIS — J9611 Chronic respiratory failure with hypoxia: Secondary | ICD-10-CM

## 2017-01-07 LAB — HEPATIC FUNCTION PANEL
ALBUMIN: 3.2 g/dL — AB (ref 3.5–5.2)
ALK PHOS: 54 U/L (ref 39–117)
ALT: 13 U/L (ref 0–53)
AST: 19 U/L (ref 0–37)
Bilirubin, Direct: 0.2 mg/dL (ref 0.0–0.3)
TOTAL PROTEIN: 6.6 g/dL (ref 6.0–8.3)
Total Bilirubin: 0.6 mg/dL (ref 0.2–1.2)

## 2017-01-07 NOTE — Patient Instructions (Addendum)
   Continue taking her medication as prescribed.  Contact me if you have any new breathing problems or questions before your next appointment.  TESTS ORDERED: 1. Spirometry with DLCO at next appointment 2. 6MWT with oxygen titration at next appointment  3. Hepatic function panel today

## 2017-01-07 NOTE — Progress Notes (Signed)
SIX MIN WALK 01/07/2017 11/12/2016 09/24/2016 06/19/2016 04/23/2016 10/21/2015  Medications All the following medications were taken at 0730 this morning: Liptior 20mg , Catapress 0.1mg , Enalapril-HCTZ 5-12.5mg , Ofev 150mg , Dorzolamide-Timolol (mg not documented in chart) atorvastatin 20mg , enalapril 5-12.5mg ,ofev 150mg  @8am --aspirin, lipitor,vit D3, lumigan,xalatan, catapress, Cq10, enalapril/HCTZ lipitor 20mg , CoQ10 100mg , Enalapril-HCTZ 5/12.5mg  --- ALL at 7am ASA 81, Lipitor 20mg , Lumigan .01%, Vit D3 2000IU, COQ10 100mg , Enalapril-HCTZ 5/12.5mg , Xalatan .005% ---ALL at 8am Lipitor, Vitamin D3, CoQ10, Enalapril  Supplimental Oxygen during Test? (L/min) Yes Yes Yes No No No  O2 Flow Rate 3 4 3  - - -  Type Pulse - Continuous - - -  Laps 4 5 5 7 7 6   Partial Lap (in Meters) 6 0 24 0 18 36  Baseline BP (sitting) 120/76 132/78 144/86 162/78 142/80 158/96  Baseline Heartrate 52 75 76 64 56 76  Baseline Dyspnea (Borg Scale) 0 0 0 0 0 0  Baseline Fatigue (Borg Scale) 0 0 0 0 0 0  Baseline SPO2 97 97 96 93 96 95  BP (sitting) 138/72 138/82 140/80 160/62 162/84 168/98  Heartrate 139 146 71 74 73 110  Dyspnea (Borg Scale) 1 3 1 1 1  0  Fatigue (Borg Scale) 1 2 0 0.5 0.5 0  SPO2 89 92 95 83 83 88  BP (sitting) 122/72 134/74 128/70 158/80 130/74 150/84  Heartrate 73 68 62 58 70 84  SPO2 97 100 99 92 90 94  Stopped or Paused before Six Minutes Yes No Yes No No No  Other Symptoms at end of Exercise See Tech Comments  - stopped the clock at 4:08 due to oxygen levels at 85% room air.  placed on 2 liters via [place], pt up to 97%.  stopped the clock at 1;43 due to 88% on 2 liters, increased to 3 liters and pt completed the test without difficulty.  pt stated that he does not wear any oxygen during the day or at night.  pt did not feel winded during the test.  - - -  Distance Completed 198 240 264 336 354 324  Tech Comments: Pt desat walking back to do the walk on RA. Pt was started on 3lpm pulsed and carried his on POC  during walk. Pt desat to 88% HR 108, time was paused with 2:47 left of the walk. Pt's O2 was increased to 4lpm pulsed and continued the walk at this liter flow.  Pt was walked a slow steady pace, nurse noticed while montioring pt during walk heart rate bouncing up and down from 140's and lower, Pt c/o dyspnea during and after walk. He was able to complete his whole walk and seemed to tolerate the walk well  pt did well during test.  did not feel tired, SOB during the test, even though his sats did drop down to 85% on room air.   Pt completed 6MW test and did desat, pt was titrated on O2 for an additional 6 mins >> O2 maintained above 90% on 2 liters O2. (see patient care coordination notes)--amg pt walked at a normal pace(per pt)- desat to 83% which patient recovered to 90% after 2 minute resting period. pt rested for a total of 10 minutes and then was titrated on O2 for 6 minutes with the nurse. Pt started walk at 98% room air and desated to 84% by the end of 1 lab around the office. pt was walked for a total of 6 minutes and O2 was titrated up to 3 Liters  ending test at 90%. See patient care coordination notes for O2 saturations. --amg -

## 2017-01-07 NOTE — Progress Notes (Signed)
Subjective:    Patient ID: Casey [name], male    DOB: [birth year], 83 y.o.   MRN: [id number]  C.C.:  Follow-up for ILD/IPF, Chronic Hypoxic Respiratory Failure, GERD, & Mediastinal Adenopathy.  HPI ILD/IPF: Previously started on Ofev. Minimal anxiety on medication without adverse effect and only minimal diarrhea. He reports his breathing is "about the same". He has decreased his physical activities outside. He denies any new problems with the Ofev.   Chronic hypoxic respiratory failure: At last appointment patient required 3 L/m to maintain saturation with exertion. Last 6 minute walk test in July demonstrated a stable oxygen saturations on 4 L/m. Patient also previously prescribed a portable oxygen concentrator. Oxygenation maintained at 4 L/m pulse dose today. He is still using his oxygen as prescribed but he is not consistently using it when he's outdoors.    GERD: Previously prescribed Protonix. No reflux or dyspepsia.  Mediastinal adenopathy: First noted April 2017 on CT imaging. No significant change on CT imaging in October 2017. Holding on further imaging pending new symptoms.    Review of Systems No chest pain or pressure. No fever, chills, or sweats. He does have some intermittent diarrhea. No nausea or abdominal pain.  No Known Allergies  Current Outpatient Prescriptions on File Prior to Visit  Medication Sig Dispense Refill  . aspirin EC 81 MG tablet Take 1 tablet by mouth daily.    Marland Kitchen atorvastatin (LIPITOR) 20 MG tablet Take 1 tablet by mouth daily.    . Cholecalciferol (VITAMIN D3) 2000 units TABS Take by mouth daily.    . cloNIDine (CATAPRES) 0.1 MG tablet Take 1 tablet by mouth at bedtime.    . Coenzyme Q10 (CO Q-10) 100 MG CAPS Take by mouth daily.    . Dorzolamide HCl-Timolol Mal (COSOPT OP) daily.    . Enalapril-Hydrochlorothiazide 5-12.5 MG tablet Take 1 tablet by mouth every morning.    . latanoprost (XALATAN) 0.005 % ophthalmic solution 2 (two) times daily.    Marland Kitchen  LUMIGAN 0.01 % SOLN daily.  10  . Nintedanib (OFEV) 150 MG CAPS Take 150 mg by mouth 2 (two) times daily.    . pantoprazole (PROTONIX) 40 MG tablet Take 1 tablet by mouth daily.     No current facility-administered medications on file prior to visit.     Past Medical History:  Diagnosis Date  . Carotid artery stenosis   . GERD (gastroesophageal reflux disease)   . Glaucoma   . Hyperlipemia   . Hypertension     Past Surgical History:  Procedure Laterality Date  . CAROTID ENDARTERECTOMY    . CHOLECYSTECTOMY    . hernia surgery    . right ankle replacement    . right leg surgery     x 2  . TONSILLECTOMY      Family History  Problem Relation Age of Onset  . Heart disease Father   . Heart disease Brother   . Diabetes Brother   . Prostate cancer Brother   . Heart disease Sister   . Diabetes Sister   . Heart disease Other   . Lung disease Neg Hx   . Rheumatologic disease Neg Hx     Social History   Social History  . Marital status: Married    Spouse name: N/A  . Number of children: N/A  . Years of education: N/A   Occupational History  . Retired     Customer service manager   Social History Main Topics  . Smoking status: Never Smoker  .  Smokeless tobacco: Never Used  . Alcohol use 0.0 oz/week     Comment: social  . Drug use: No  . Sexual activity: Not Asked   Other Topics Concern  . None   Social History Narrative   Originally from [place]. Has lived in [place], [place], [place], [place]. Remote travel to [place], [place], & [place]. Previously worked Education administrator. Currently has a dog. No bird, mold, asbestos, or hot tub exposure. Patient does have a pool. Enjoys doing yard work. Remote exposure to silage and hay. Also worked with cattle.       Objective:   Physical Exam BP 118/78 (BP Location: Left Arm, Cuff Size: Normal)   Pulse 62   Ht 5' 9"  (1.753 m)   Wt 141 lb (64 kg)   SpO2 97%   BMI 20.82 kg/m   General:  Elderly male. Accompanied by wife. No distress. Integument:  Warm  & dry. No rash on exposed skin.  Lymphatics: No appreciated cervical or supraclavicular lymphadenopathy. HEENT:  Moist mucus membranes. No scleral icterus. No oral ulcers Cardiovascular:  Regular rate. No edema. Regular rhythm.  Pulmonary:  Basilar Velcro crackles unchanged. Normal work of breathing on room air. Abdomen: Soft. Normal bowel sounds. Nondistended. Musculoskeletal:  Normal bulk and tone. No joint deformity or effusion appreciated.   PFT 01/07/17: FVC 2.72 L (81%) FEV1 2.31 L (100%) FEV1/FVC 0.85 FEF 25-75 2.60 L (180%) DLCO corrected 19% 09/24/16: FVC 3.02 L (89%) FEV1 2.56 L (110%) FEV1/FVC 0.85 FEF 25-75 3.81 L (260%) DLCO corrected 20% 06/19/16: FVC 3.02 L (89%) FEV1 2.48 L (107%) FEV1/FVC 0.82 FEF 25-75 3.26 L (221%) DLCO corrected 27% 04/23/16: FVC 2.70 L (82%) FEV1 2.38 L (102%) FEV1/FVC 0.86 FEF 25-75 2.98 L (201%) DLCO corrected 31% (hgb 14.6)  6MWT 01/07/17:  Walked 198 meters / Baseline Sat 97% on 3 L/m pulse / Nadir Sat 88% on 3 L/m pulse @ 3:13 (required 4 L/m pulse with exertion) 11/12/16:  Walked 240 meters / Baseline Sat 97% on 4 L/m / Nadir Sat 92% on 4 L/m @ end of test (HR increased to 140bpm during test) 09/24/16:  Walked 264 meters / Baseline Sat 96% on RA / Nadir Sat 85% on RA @ 4:08 (required 3 L/m to maintain with exertion) 06/19/16:  Walked 336 meters / Baseline Sat 93% on RA / Nadir Sat 83% on RA @ end of test (required 2 L/m to maintain with repeat walk) 04/23/16:  Walked 354 meters / Baseline Sat 96% on RA / Nadir Sat 83% on RA @ end of test (required 3 L/m to maintain with repeat walk) 10/21/15:  Walked 324 meters / Baseline Sat 95% on RA / Nadir Sat 88% on RA @ end of test  IMAGING CT CHEST W/O 02/27/16 (previously reviewed by me):  Bilateral intralobular septal thickening and fibrotic changes with traction bronchiectasis relatively unchanged when compared with previous CT imaging. Mediastinal adenopathy persists with largest lymph node is subcarinal  measuring approximately 1.6 cm in short axis. No pleural effusion or thickening. No pericardial effusion. No developing nodule or mass.  HRCT CHEST W/O 09/27/15 (previously reviewed by me):  No pericardial effusion. No pleural effusion or thickening. Traction bronchiectasis with subpleural and primary bronchovascular reticular changes as well as groundglass. Involvement of both apical and basilar regions. Early honeycomb changes. Small to moderate hiatal hernia. Calcifications noted within the liver and spleen. No axillary adenopathy. Enlarged mediastinal lymph nodes measuring up to 1.8 cm in short axis present.  CT CHEST/ABD/PELVIS  W/ 08/19/15 (previously reviewed by me): Mediastinal lymphadenopathy noted with the largest lymph node measuring 1.6 cm at level 7, 1.3 cm 4L, & 1.1 cm 4R positions. Calcifications noted within the spleen and liver suggestive of prior granulomatous disease. No pleural effusion or thickening. Patchy bilateral intralobular septal thickening and reticulation consistent with interstitial lung disease. This appears both centrally in the lungs as well as peripherally occurring throughout all fields with slightly less involvement in the upper lung zones. Suggestion of traction bronchiectasis. No obvious groundglass opacities. There is also suggestion of honeycomb changes. Remote T-8 compression fracture with advanced anterior height loss. Mild finding enlargement of left inguinal canal. Renal cysts on the left noted with mild renal atrophy and no hydronephrosis. Enlarged central prostate that is symmetric. Colonic diverticulosis noted. No appendicitis. No mass or adenopathy. No ascites or pneumoperitoneum.  PORT CXR 02/01/09 (previously reviewed by me): Heart appears to be largely normal in size and unchanged when compared with previous portable chest x-ray from 2009. Mediastinum normal in contour. No obvious parenchymal opacities or increased interstitial markings. No pleural  effusion.  LABS 10/17/16 CBC: 10.7/13.9/41.8/236 BMP: 135/4.3/104/22/19/1.19/83/8.5 LFT: 3.4/6.3/0.6/61/19/18  10/21/15 Anti-CCP:  <16 RF:  <10 Chromatin antibody:  <0.2 Centromere antibody screen:  <1.0 Double-stranded DNA antibody: 1 ENA Smith antibody:  <1 SSA:   <1.0 SSB:   <1.0 SCL 70:   <1.0  09/22/15 ProBNP: 97.0 CRP: 0.5 ESR: 16 ANA: Negative Hypersensitivity Pneumonitis Panel: Negative   08/15/15 CBC: 9.1/14.2/43.2/239 BMP: 1:30/3.9/105/24/20/1.43/98/9.0 LFT: 3.8/6.7/0.4/73/19/20 ESR: 11 CRP: 5.5    Assessment & Plan:  83 y.o. male with ILD/IPF, chronic hypoxic respiratory failure, & GERD. Patient's spirometry has declined somewhat since previous testing. Despite this his oxygen requirement remained stable. His walk test distance is slightly worse today. His reflux is well controlled. I encouraged him to continue using his oxygen with exertion to prevent any myocardial strain. I instructed the patient to notify me if he had any new breathing problems before his next appointment. He has minimal to no side effects from Ofev and will be continuing on this medication.  1. ILD/IPF: Continuing Ofev. Checking hepatic function panel today. Repeat spirometry with DLCO next appointment. 2. Chronic hypoxic respiratory failure: Patient encouraged use oxygen with exertion at 4 L/m pulseportable concentrator. Repeat 6 minute walk test with oxygen titration at next appointment. 3. GERD: Continue Protonix. No changes. 4. Health maintenance: Managed by his PCP.  Previously reported influenza, Pneumovax, and Prevnar vaccination. 5. Follow-up: Return to clinic in 3 months or sooner if needed.  Sonia Baller Ashok Cordia, M.D. [facility] Pager:  [phone] After 3pm or if no response, call [phone] 10:05 AM 01/07/17

## 2017-01-15 DIAGNOSIS — I10 Essential (primary) hypertension: Secondary | ICD-10-CM | POA: Diagnosis not present

## 2017-01-15 DIAGNOSIS — Z6822 Body mass index (BMI) 22.0-22.9, adult: Secondary | ICD-10-CM | POA: Diagnosis not present

## 2017-01-21 DIAGNOSIS — I951 Orthostatic hypotension: Secondary | ICD-10-CM | POA: Diagnosis not present

## 2017-01-21 DIAGNOSIS — I491 Atrial premature depolarization: Secondary | ICD-10-CM | POA: Diagnosis not present

## 2017-01-21 DIAGNOSIS — Z6821 Body mass index (BMI) 21.0-21.9, adult: Secondary | ICD-10-CM | POA: Diagnosis not present

## 2017-02-04 ENCOUNTER — Telehealth: Payer: Self-pay | Admitting: Pulmonary Disease

## 2017-02-04 DIAGNOSIS — J849 Interstitial pulmonary disease, unspecified: Secondary | ICD-10-CM

## 2017-02-04 NOTE — Telephone Encounter (Signed)
JN there was an email that was sent to you about this pt--please call the pts son Aline Brochure when you do call---his number is listed in this phone note. thanks

## 2017-02-04 NOTE — Telephone Encounter (Signed)
pulm rehab referral placed as requested by JN.

## 2017-02-04 NOTE — Telephone Encounter (Signed)
I spoke with the patient's daughter-in-law today and we discussed his clinical state. Family was interested in Pulmonary Rehab. We also discussed the local IPF support group but family think that he would be reluctant given his general mentality. Family feel that it is hard for him to accept this. I attempted to call his son on the number listed but it went to voicemail & I left a message.  Please put in a referral to [facility] down in [place] at [place]. Thanks.

## 2017-02-09 DIAGNOSIS — Z23 Encounter for immunization: Secondary | ICD-10-CM | POA: Diagnosis not present

## 2017-02-12 DIAGNOSIS — H401133 Primary open-angle glaucoma, bilateral, severe stage: Secondary | ICD-10-CM | POA: Diagnosis not present

## 2017-02-20 DIAGNOSIS — Z6822 Body mass index (BMI) 22.0-22.9, adult: Secondary | ICD-10-CM | POA: Diagnosis not present

## 2017-02-20 DIAGNOSIS — F419 Anxiety disorder, unspecified: Secondary | ICD-10-CM | POA: Diagnosis not present

## 2017-02-20 DIAGNOSIS — Z1339 Encounter for screening examination for other mental health and behavioral disorders: Secondary | ICD-10-CM | POA: Diagnosis not present

## 2017-02-26 DIAGNOSIS — J8410 Pulmonary fibrosis, unspecified: Secondary | ICD-10-CM | POA: Diagnosis not present

## 2017-03-01 DIAGNOSIS — J8410 Pulmonary fibrosis, unspecified: Secondary | ICD-10-CM | POA: Diagnosis not present

## 2017-03-04 DIAGNOSIS — J8410 Pulmonary fibrosis, unspecified: Secondary | ICD-10-CM | POA: Diagnosis not present

## 2017-03-05 ENCOUNTER — Telehealth: Payer: Self-pay | Admitting: Pulmonary Disease

## 2017-03-05 NOTE — Telephone Encounter (Signed)
Contacted by patient's wife via email. Discussed the fact that she is concerned his portable oxygen concentrator is alarming frequently. Patient currently is participating in pulmonary rehabilitation in [place] and enjoying his activity there. Reportedly his endurance is improving and he is "finding out he can do more than he felt he could". He is using a tank during pulmonary rehabilitation with continuous flow. His wife has contacted his DME company a couple of times to address his alarming portable concentrator. However, they are short staffed on drivers and reported that they would be unable to leave him with a replacement device if they picked his up. I believe he may benefit from a continuous flow portable concentrator. She is going to check with his DME company tomorrow to see if there is an alternative device available and/or have his device checked out. She is also interested in possibly transitioning to a DME company in [place] that would be closer for them. We also discussed his oxygen requirement which seems to be only with exertion. The patient's wife is monitoring his oxygen at home and at rest he needs no supplemental oxygen. I instructed her to contact me if she had any issues or questions further.

## 2017-03-06 DIAGNOSIS — J8410 Pulmonary fibrosis, unspecified: Secondary | ICD-10-CM | POA: Diagnosis not present

## 2017-03-08 ENCOUNTER — Telehealth: Payer: Self-pay | Admitting: Pulmonary Disease

## 2017-03-08 DIAGNOSIS — J84112 Idiopathic pulmonary fibrosis: Secondary | ICD-10-CM

## 2017-03-08 DIAGNOSIS — J8410 Pulmonary fibrosis, unspecified: Secondary | ICD-10-CM | POA: Diagnosis not present

## 2017-03-08 NOTE — Telephone Encounter (Signed)
Per JN staff message, order for a continuous flow portable concentrator for Casey [name]. He has a portable one now but this is on pulse. Additionally can you make sure that he has some smaller tanks available with his order for home oxygen for use, some size A or B (newer nomenclature M4 to M9 sizes). His son talked with Kern Reap at the [employer] in [place] [phone]). They would like to switch over to a closer location but stay with the same company. Thanks.

## 2017-03-11 ENCOUNTER — Telehealth: Payer: Self-pay | Admitting: Pulmonary Disease

## 2017-03-11 DIAGNOSIS — J8410 Pulmonary fibrosis, unspecified: Secondary | ICD-10-CM | POA: Diagnosis not present

## 2017-03-11 NOTE — Telephone Encounter (Signed)
Referral Notes  Number of Notes: 2  Type Date User Summary Attachment  General 03/08/2017 3:23 PM Nani Gasser P - -  Note   Pt with [facility] but wants to use [facility] in [place].  Message sent to Kim/Lynn at [facility].        Type Date User Summary Attachment  Provider Comments 03/08/2017 3:17 PM Cox, Berline Lopes, CMA Provider Comments -  Note   Patient requesting smaller tanks for home oxygen for use, some size A or B (newer nomenclature M4 to M9 sizes). His son talked with Kern Reap at the [employer] in [place] [phone]). They would like to switch over to a closer location but stay with the same company. Thanks.         I called [facility] and have to await a call back to check status of this order.

## 2017-03-11 NOTE — Telephone Encounter (Signed)
°  Wife Danton Clap [phone]) states when the order is sent to [facility], put it to: Attn: Lorna.Marland KitchenMarland Kitchen

## 2017-03-12 DIAGNOSIS — H401133 Primary open-angle glaucoma, bilateral, severe stage: Secondary | ICD-10-CM | POA: Diagnosis not present

## 2017-03-12 NOTE — Telephone Encounter (Signed)
Spoke with pt's wife and advised her the message from [facility]. She understood and will await contact from [place].

## 2017-03-12 NOTE — Telephone Encounter (Signed)
Pt is with [facility].  I had to send the order to them and have them to transfer to [place].  I sent the order on Friday & was notified by Jeani Hawking at [facility] this morning that she got the order.

## 2017-03-12 NOTE — Telephone Encounter (Signed)
SM please advise if this order has been taken care of by [facility]?  thanks

## 2017-03-13 DIAGNOSIS — J8410 Pulmonary fibrosis, unspecified: Secondary | ICD-10-CM | POA: Diagnosis not present

## 2017-03-15 DIAGNOSIS — J84112 Idiopathic pulmonary fibrosis: Secondary | ICD-10-CM | POA: Diagnosis not present

## 2017-03-18 DIAGNOSIS — J84112 Idiopathic pulmonary fibrosis: Secondary | ICD-10-CM | POA: Diagnosis not present

## 2017-03-20 DIAGNOSIS — J84112 Idiopathic pulmonary fibrosis: Secondary | ICD-10-CM | POA: Diagnosis not present

## 2017-03-22 DIAGNOSIS — J84112 Idiopathic pulmonary fibrosis: Secondary | ICD-10-CM | POA: Diagnosis not present

## 2017-03-25 DIAGNOSIS — J84112 Idiopathic pulmonary fibrosis: Secondary | ICD-10-CM | POA: Diagnosis not present

## 2017-03-27 DIAGNOSIS — J84112 Idiopathic pulmonary fibrosis: Secondary | ICD-10-CM | POA: Diagnosis not present

## 2017-03-29 DIAGNOSIS — J84112 Idiopathic pulmonary fibrosis: Secondary | ICD-10-CM | POA: Diagnosis not present

## 2017-04-01 DIAGNOSIS — J84112 Idiopathic pulmonary fibrosis: Secondary | ICD-10-CM | POA: Diagnosis not present

## 2017-04-03 DIAGNOSIS — J84112 Idiopathic pulmonary fibrosis: Secondary | ICD-10-CM | POA: Diagnosis not present

## 2017-04-08 DIAGNOSIS — J84112 Idiopathic pulmonary fibrosis: Secondary | ICD-10-CM | POA: Diagnosis not present

## 2017-04-10 ENCOUNTER — Encounter: Payer: Self-pay | Admitting: Pulmonary Disease

## 2017-04-10 ENCOUNTER — Ambulatory Visit (INDEPENDENT_AMBULATORY_CARE_PROVIDER_SITE_OTHER): Payer: Medicare Other

## 2017-04-10 ENCOUNTER — Ambulatory Visit (INDEPENDENT_AMBULATORY_CARE_PROVIDER_SITE_OTHER): Payer: Medicare Other | Admitting: Pulmonary Disease

## 2017-04-10 ENCOUNTER — Other Ambulatory Visit (INDEPENDENT_AMBULATORY_CARE_PROVIDER_SITE_OTHER): Payer: Medicare Other

## 2017-04-10 VITALS — BP 128/72 | HR 94 | Ht 69.0 in | Wt 142.1 lb

## 2017-04-10 DIAGNOSIS — J84112 Idiopathic pulmonary fibrosis: Secondary | ICD-10-CM

## 2017-04-10 DIAGNOSIS — K219 Gastro-esophageal reflux disease without esophagitis: Secondary | ICD-10-CM

## 2017-04-10 DIAGNOSIS — J849 Interstitial pulmonary disease, unspecified: Secondary | ICD-10-CM

## 2017-04-10 DIAGNOSIS — J9611 Chronic respiratory failure with hypoxia: Secondary | ICD-10-CM | POA: Diagnosis not present

## 2017-04-10 LAB — PULMONARY FUNCTION TEST
FEF 25-75 PRE: 2.87 L/s
FEF2575-%Pred-Pre: 200 %
FEV1-%Pred-Pre: 100 %
FEV1-PRE: 2.3 L
FEV1FVC-%Pred-Pre: 120 %
FEV6-%PRED-PRE: 88 %
FEV6-Pre: 2.72 L
FEV6FVC-%PRED-PRE: 108 %
FVC-%PRED-PRE: 81 %
FVC-Pre: 2.72 L
Pre FEV1/FVC ratio: 84 %
Pre FEV6/FVC Ratio: 100 %

## 2017-04-10 LAB — HEPATIC FUNCTION PANEL
ALBUMIN: 3.2 g/dL — AB (ref 3.5–5.2)
ALK PHOS: 57 U/L (ref 39–117)
ALT: 10 U/L (ref 0–53)
AST: 17 U/L (ref 0–37)
Bilirubin, Direct: 0.1 mg/dL (ref 0.0–0.3)
TOTAL PROTEIN: 6.7 g/dL (ref 6.0–8.3)
Total Bilirubin: 0.5 mg/dL (ref 0.2–1.2)

## 2017-04-10 NOTE — Progress Notes (Signed)
Subjective:    Patient ID: Casey [name], male    DOB: 04-Jan-1929, 83 y.o.   MRN: [id number]  C.C.:  Follow-up for ILD/IPF, Chronic Hypoxic Respiratory Failure, GERD, & Mediastinal Adenopathy.  HPI ILD/IPF: Previously prescribed Ofev. Patient also previously referred to pulmonary rehabilitation. Patient was unable to perform pulmonary function testing today due to his dyspnea. He reports he is doing rehab and well with it.  Chronic hypoxic respiratory failure: At last appointment patient required 4 L/m pulse dose oxygen to maintain saturation. Encouraged patient to consistently use his oxygen therapy at last appointment. They are increasing his oxygen to 6 L/m at times.   GERD: Prescribed Protonix. No reflux or dyspepsia.   Mediastinal adenopathy: First noted April 2017 on CT imaging. No significant change on CT imaging in October 2017. Holding on further imaging pending new symptoms.    Review of Systems No chest pain or pressure. No abdominal pain or nausea. He has intermittent, infrequent diarrhea. No fever or chills.   No Known Allergies  Current Outpatient Medications on File Prior to Visit  Medication Sig Dispense Refill  . aspirin EC 81 MG tablet Take 1 tablet by mouth daily.    Marland Kitchen atorvastatin (LIPITOR) 20 MG tablet Take 1 tablet by mouth daily.    . Cholecalciferol (VITAMIN D3) 2000 units TABS Take by mouth daily.    . cloNIDine (CATAPRES) 0.1 MG tablet Take 1 tablet by mouth at bedtime.    . Coenzyme Q10 (CO Q-10) 100 MG CAPS Take by mouth daily.    . Dorzolamide HCl-Timolol Mal (COSOPT OP) daily.    . Enalapril-Hydrochlorothiazide 5-12.5 MG tablet Take 1 tablet by mouth every morning.    . escitalopram (LEXAPRO) 10 MG tablet Take 10 mg by mouth daily.    Marland Kitchen latanoprost (XALATAN) 0.005 % ophthalmic solution 2 (two) times daily.    Marland Kitchen LUMIGAN 0.01 % SOLN daily.  10  . Nintedanib (OFEV) 150 MG CAPS Take 150 mg by mouth 2 (two) times daily.    . pantoprazole (PROTONIX) 40 MG  tablet Take 1 tablet by mouth daily.     No current facility-administered medications on file prior to visit.     Past Medical History:  Diagnosis Date  . Carotid artery stenosis   . GERD (gastroesophageal reflux disease)   . Glaucoma   . Hyperlipemia   . Hypertension     Past Surgical History:  Procedure Laterality Date  . CAROTID ENDARTERECTOMY    . CHOLECYSTECTOMY    . hernia surgery    . right ankle replacement    . right leg surgery     x 2  . TONSILLECTOMY      Family History  Problem Relation Age of Onset  . Heart disease Father   . Heart disease Brother   . Diabetes Brother   . Prostate cancer Brother   . Heart disease Sister   . Diabetes Sister   . Heart disease Other   . Lung disease Neg Hx   . Rheumatologic disease Neg Hx     Social History   Socioeconomic History  . Marital status: Married    Spouse name: None  . Number of children: None  . Years of education: None  . Highest education level: None  Social Needs  . Financial resource strain: None  . Food insecurity - worry: None  . Food insecurity - inability: None  . Transportation needs - medical: None  . Transportation needs - non-medical: None  Occupational  History  . Occupation: Retired    Comment: Customer service manager  Tobacco Use  . Smoking status: Never Smoker  . Smokeless tobacco: Never Used  Substance and Sexual Activity  . Alcohol use: Yes    Alcohol/week: 0.0 oz    Comment: social  . Drug use: No  . Sexual activity: None  Other Topics Concern  . None  Social History Narrative   Originally from [place]. Has lived in [place], [place], [place], [place]. Remote travel to [place], [place], & [place]. Previously worked Education administrator. Currently has a dog. No bird, mold, asbestos, or hot tub exposure. Patient does have a pool. Enjoys doing yard work. Remote exposure to silage and hay. Also worked with cattle.       Objective:   Physical Exam BP 128/72 (BP Location: Left Arm, Cuff Size: Normal)   Pulse 94    Ht 5' 9"  (1.753 m)   Wt 142 lb 2 oz (64.5 kg)   SpO2 97%   BMI 20.99 kg/m   General:  Awake. Elderly Caucasian male. Accompanied by wife, son, and daughter-in-law today..  Integument:  No rash. Warm. Dry. Extremities:  No cyanosis or clubbing.  HEENT:  Tacky mucous membranes. No scleral icterus. No oral ulcers. Cardiovascular:  Regular rate. No edema. No appreciable JVD.  Pulmonary:  Mild basilar Velcro crackles. Normal work of breathing. Abdomen: Soft. Normal bowel sounds. Nondistended.  Musculoskeletal:  Normal bulk and tone. No joint deformity or effusion appreciated. Neurological:  Cranial nerves 2-12 grossly in tact. No meningismus. Moving all 4 extremities equally.    PFT 01/07/17: FVC 2.72 L (81%) FEV1 2.31 L (100%) FEV1/FVC 0.85 FEF 25-75 2.60 L (180%) DLCO corrected 19% 09/24/16: FVC 3.02 L (89%) FEV1 2.56 L (110%) FEV1/FVC 0.85 FEF 25-75 3.81 L (260%) DLCO corrected 20% 06/19/16: FVC 3.02 L (89%) FEV1 2.48 L (107%) FEV1/FVC 0.82 FEF 25-75 3.26 L (221%) DLCO corrected 27% 04/23/16: FVC 2.70 L (82%) FEV1 2.38 L (102%) FEV1/FVC 0.86 FEF 25-75 2.98 L (201%) DLCO corrected 31% (hgb 14.6)  6MWT 01/07/17:  Walked 198 meters / Baseline Sat 97% on 3 L/m pulse / Nadir Sat 88% on 3 L/m pulse @ 3:13 (required 4 L/m pulse with exertion) 11/12/16:  Walked 240 meters / Baseline Sat 97% on 4 L/m / Nadir Sat 92% on 4 L/m @ end of test (HR increased to 140bpm during test) 09/24/16:  Walked 264 meters / Baseline Sat 96% on RA / Nadir Sat 85% on RA @ 4:08 (required 3 L/m to maintain with exertion) 06/19/16:  Walked 336 meters / Baseline Sat 93% on RA / Nadir Sat 83% on RA @ end of test (required 2 L/m to maintain with repeat walk) 04/23/16:  Walked 354 meters / Baseline Sat 96% on RA / Nadir Sat 83% on RA @ end of test (required 3 L/m to maintain with repeat walk) 10/21/15:  Walked 324 meters / Baseline Sat 95% on RA / Nadir Sat 88% on RA @ end of test  IMAGING CT CHEST W/O 02/27/16 (previously  reviewed by me):  Bilateral intralobular septal thickening and fibrotic changes with traction bronchiectasis relatively unchanged when compared with previous CT imaging. Mediastinal adenopathy persists with largest lymph node is subcarinal measuring approximately 1.6 cm in short axis. No pleural effusion or thickening. No pericardial effusion. No developing nodule or mass.  HRCT CHEST W/O 09/27/15 (previously reviewed by me):  No pericardial effusion. No pleural effusion or thickening. Traction bronchiectasis with subpleural and primary bronchovascular reticular changes  as well as groundglass. Involvement of both apical and basilar regions. Early honeycomb changes. Small to moderate hiatal hernia. Calcifications noted within the liver and spleen. No axillary adenopathy. Enlarged mediastinal lymph nodes measuring up to 1.8 cm in short axis present.  CT CHEST/ABD/PELVIS W/ 08/19/15 (previously reviewed by me): Mediastinal lymphadenopathy noted with the largest lymph node measuring 1.6 cm at level 7, 1.3 cm 4L, & 1.1 cm 4R positions. Calcifications noted within the spleen and liver suggestive of prior granulomatous disease. No pleural effusion or thickening. Patchy bilateral intralobular septal thickening and reticulation consistent with interstitial lung disease. This appears both centrally in the lungs as well as peripherally occurring throughout all fields with slightly less involvement in the upper lung zones. Suggestion of traction bronchiectasis. No obvious groundglass opacities. There is also suggestion of honeycomb changes. Remote T-8 compression fracture with advanced anterior height loss. Mild finding enlargement of left inguinal canal. Renal cysts on the left noted with mild renal atrophy and no hydronephrosis. Enlarged central prostate that is symmetric. Colonic diverticulosis noted. No appendicitis. No mass or adenopathy. No ascites or pneumoperitoneum.  PORT CXR 02/01/09 (previously reviewed by me):  Heart appears to be largely normal in size and unchanged when compared with previous portable chest x-ray from 2009. Mediastinum normal in contour. No obvious parenchymal opacities or increased interstitial markings. No pleural effusion.  LABS 10/17/16 CBC: 10.7/13.9/41.8/236 BMP: 135/4.3/104/22/19/1.19/83/8.5 LFT: 3.4/6.3/0.6/61/19/18  10/21/15 Anti-CCP:  <16 RF:  <10 Chromatin antibody:  <0.2 Centromere antibody screen:  <1.0 Double-stranded DNA antibody: 1 ENA Smith antibody:  <1 SSA:   <1.0 SSB:   <1.0 SCL 70:   <1.0  09/22/15 ProBNP: 97.0 CRP: 0.5 ESR: 16 ANA: Negative Hypersensitivity Pneumonitis Panel: Negative   08/15/15 CBC: 9.1/14.2/43.2/239 BMP: 1:30/3.9/105/24/20/1.43/98/9.0 LFT: 3.8/6.7/0.4/73/19/20 ESR: 11 CRP: 5.5    Assessment & Plan:  83 y.o. male with ILD/IPF. He is participating in pulmonary rehabilitation with excellent vigor. I am concerned that he continues to have reported clinical dyspnea with exertion. His family feel that his level of dyspnea is slowly worsening which is concerning. He was unable to perform pulmonary function testing today to allow for a comparison with previous testing. We reviewed the expectation with regards to clinical deterioration with IPF and the hope that the medications would halt rapid deterioration. At this time I am holding off on transitioning from Ofev to [place] but believe this would be a viable option for the patient if he does not improve with pulmonary rehabilitation. I will check a 6 minute walk test at next appointment to help provide some objective measure of his capabilities. I instructed the patient and his family to contact my office if he had any new breathing problems or questions before his next appointment.  1. ILD/IPF: Continuing Ofev. Consider Esbriet at next appointment pending 6 minute walk test. Checking hepatic function panel today. Patient continuing with pulmonary rehabilitation. 2. Chronic hypoxic  respiratory failure: Continuing on oxygen as previously prescribed. Checking overnight oximetry on room air. Checking 6 minute walk test with oxygen titration at next appointment. 3. GERD: Controlled with Protonix. No new medications. 4. Follow-up: Return to clinic in 6 weeks or sooner if needed.  Sonia Baller Ashok Cordia, M.D. [facility] Pager:  [phone] After 3pm or if no response, call [phone] 1:55 PM 04/10/17

## 2017-04-10 NOTE — Patient Instructions (Addendum)
   Continue taking your medications as prescribed and using your oxygen.  We will do an overnight oxygen test on room air and a walk test at your next appointment.  We will make sure you see Dr. Lake Bells at your follow-up.  Please call us if you have any new breathing problems or questions before then.  TESTS ORDERED: 1. Hepatic Function Panel Today 2. Overnight Oximetry on room air 3. 6MWT with oxygen titration at next appointment

## 2017-04-12 DIAGNOSIS — J84112 Idiopathic pulmonary fibrosis: Secondary | ICD-10-CM | POA: Diagnosis not present

## 2017-04-15 DIAGNOSIS — J84112 Idiopathic pulmonary fibrosis: Secondary | ICD-10-CM | POA: Diagnosis not present

## 2017-04-17 DIAGNOSIS — J84112 Idiopathic pulmonary fibrosis: Secondary | ICD-10-CM | POA: Diagnosis not present

## 2017-04-18 ENCOUNTER — Telehealth: Payer: Self-pay | Admitting: Pulmonary Disease

## 2017-04-18 DIAGNOSIS — J84111 Idiopathic interstitial pneumonia, not otherwise specified: Secondary | ICD-10-CM

## 2017-04-18 NOTE — Telephone Encounter (Signed)
Left message for Casey [name] to call back tomorrow.

## 2017-04-19 DIAGNOSIS — J84112 Idiopathic pulmonary fibrosis: Secondary | ICD-10-CM | POA: Diagnosis not present

## 2017-04-19 NOTE — Telephone Encounter (Signed)
Called number to speak to [facility] but there was no answer and no VM to leave message. Will try again later.

## 2017-04-24 DIAGNOSIS — J84112 Idiopathic pulmonary fibrosis: Secondary | ICD-10-CM | POA: Diagnosis not present

## 2017-04-24 NOTE — Telephone Encounter (Signed)
Attempted to contact [facility]. There was no answer and I could not leave a message. Will try back.

## 2017-04-25 NOTE — Telephone Encounter (Signed)
Order placed and nothing further is needed.

## 2017-04-25 NOTE — Telephone Encounter (Signed)
Spoke with [place], she states pt needs an order for a larger concentrator. He has been going to pulmonary rehab and he uses 6L and wants the big concentrator in the house. Can we order this and sent to [place] [facility]? TP can you advise in JN absence.

## 2017-04-25 NOTE — Telephone Encounter (Signed)
Per TP: okay to order larger concentrator that will accommodate patient's increased liter flow.  Thank you.

## 2017-04-26 DIAGNOSIS — J84112 Idiopathic pulmonary fibrosis: Secondary | ICD-10-CM | POA: Diagnosis not present

## 2017-04-29 DIAGNOSIS — J84112 Idiopathic pulmonary fibrosis: Secondary | ICD-10-CM | POA: Diagnosis not present

## 2017-04-30 ENCOUNTER — Telehealth: Payer: Self-pay | Admitting: Pulmonary Disease

## 2017-04-30 DIAGNOSIS — J84112 Idiopathic pulmonary fibrosis: Secondary | ICD-10-CM

## 2017-04-30 NOTE — Telephone Encounter (Signed)
83 y.o. male with ILD/IPF. He is participating in pulmonary rehabilitation with excellent vigor. I am concerned that he continues to have reported clinical dyspnea with exertion. His family feel that his level of dyspnea is slowly worsening which is concerning. He was unable to perform pulmonary function testing today to allow for a comparison with previous testing. We reviewed the expectation with regards to clinical deterioration with IPF and the hope that the medications would halt rapid deterioration. At this time I am holding off on transitioning from Ofev to [place] but believe this would be a viable option for the patient if he does not improve with pulmonary rehabilitation. I will check a 6 minute walk test at next appointment to help provide some objective measure of his capabilities. I instructed the patient and his family to contact my office if he had any new breathing problems or questions before his next appointment.  1. ILD/IPF: Continuing Ofev. Consider Esbriet at next appointment pending 6 minute walk test. Checking hepatic function panel today. Patient continuing with pulmonary rehabilitation. 2. Chronic hypoxic respiratory failure: Continuing on oxygen as previously prescribed. Checking overnight oximetry on room air. Checking 6 minute walk test with oxygen titration at next appointment. 3. GERD: Controlled with Protonix. No new medications. 4. Follow-up: Return to clinic in 6 weeks or sooner if needed.  Called and spoke with pts wife and she stated that the pt needs a new POC to use when he is up moving around.  She stated that when he is in rehab they have the pt on 6 to 8 liters and he does well on this.  She stated that the machine that he has at home only goes up to 5 liters and does not last long enough if he is up moving around.  They do use [facility] and would like an order sent to them to have this delivered next week.  She is aware of what JN said in his last OV note about  transitioning the pt from ofev to esbriet at this time---they will wait for the appt with BQ in January to discuss.    BQ please advise. Thanks,  No Known Allergies

## 2017-04-30 NOTE — Telephone Encounter (Signed)
Called pt and advised message from the provider. Pt understood and verbalized understanding. Nothing further is needed.   Order placed to [place].

## 2017-04-30 NOTE — Telephone Encounter (Signed)
OK to Rx home concentrator that goes up to 10LPM

## 2017-05-01 DIAGNOSIS — J84112 Idiopathic pulmonary fibrosis: Secondary | ICD-10-CM | POA: Diagnosis not present

## 2017-05-03 DIAGNOSIS — J84112 Idiopathic pulmonary fibrosis: Secondary | ICD-10-CM | POA: Diagnosis not present

## 2017-05-08 ENCOUNTER — Telehealth: Payer: Self-pay | Admitting: Pulmonary Disease

## 2017-05-08 DIAGNOSIS — J84112 Idiopathic pulmonary fibrosis: Secondary | ICD-10-CM | POA: Diagnosis not present

## 2017-05-08 DIAGNOSIS — J849 Interstitial pulmonary disease, unspecified: Secondary | ICD-10-CM

## 2017-05-08 NOTE — Telephone Encounter (Signed)
Pt is calling back [phone]

## 2017-05-08 NOTE — Telephone Encounter (Signed)
Spoke with pt, he states he will have his wife call when she returns.   Type Date User Summary Attachment  General 05/01/2017 9:17 AM Harland German - -  Note   Community message confirmation received from [facility]          Type Date User Summary Attachment  General 04/30/2017 4:13 PM Harland German - -  Note   [place], Mickeal Skinner, Lonn [place], [place]; Rex Kras; Loreen Freud        Patient is [facility]   Maudie Mercury please pull & process order            I called [facility] (she is not with [facility]). They did not receive the order for the home concentrator. I have to verify the liter flow when wife calls back.

## 2017-05-09 NOTE — Telephone Encounter (Signed)
The order was sent to [facility] and it needs to be sent to [facility]. Can we re-fax order?

## 2017-05-09 NOTE — Telephone Encounter (Signed)
Ok I called [facility] at [phone] she said they will need the patients settings for the oxygen please call [phone]

## 2017-05-09 NOTE — Telephone Encounter (Signed)
Left message to call back  

## 2017-05-09 NOTE — Telephone Encounter (Signed)
Per last OV note on 04/10/17. Pt was maintaining on 6L.  Order has been placed to [facility] based off of this information. Nothing further is needed.

## 2017-05-10 DIAGNOSIS — J84112 Idiopathic pulmonary fibrosis: Secondary | ICD-10-CM | POA: Diagnosis not present

## 2017-05-13 DIAGNOSIS — J84112 Idiopathic pulmonary fibrosis: Secondary | ICD-10-CM | POA: Diagnosis not present

## 2017-05-15 DIAGNOSIS — J449 Chronic obstructive pulmonary disease, unspecified: Secondary | ICD-10-CM | POA: Diagnosis not present

## 2017-05-17 DIAGNOSIS — H401133 Primary open-angle glaucoma, bilateral, severe stage: Secondary | ICD-10-CM | POA: Diagnosis not present

## 2017-05-20 DIAGNOSIS — J449 Chronic obstructive pulmonary disease, unspecified: Secondary | ICD-10-CM | POA: Diagnosis not present

## 2017-05-22 DIAGNOSIS — J449 Chronic obstructive pulmonary disease, unspecified: Secondary | ICD-10-CM | POA: Diagnosis not present

## 2017-05-24 DIAGNOSIS — J449 Chronic obstructive pulmonary disease, unspecified: Secondary | ICD-10-CM | POA: Diagnosis not present

## 2017-05-27 DIAGNOSIS — J449 Chronic obstructive pulmonary disease, unspecified: Secondary | ICD-10-CM | POA: Diagnosis not present

## 2017-05-28 ENCOUNTER — Other Ambulatory Visit (INDEPENDENT_AMBULATORY_CARE_PROVIDER_SITE_OTHER): Payer: Medicare Other

## 2017-05-28 ENCOUNTER — Ambulatory Visit (INDEPENDENT_AMBULATORY_CARE_PROVIDER_SITE_OTHER): Payer: Medicare Other | Admitting: Pulmonary Disease

## 2017-05-28 ENCOUNTER — Ambulatory Visit (INDEPENDENT_AMBULATORY_CARE_PROVIDER_SITE_OTHER): Payer: Medicare Other | Admitting: *Deleted

## 2017-05-28 ENCOUNTER — Encounter: Payer: Self-pay | Admitting: Pulmonary Disease

## 2017-05-28 VITALS — BP 124/64 | HR 105 | Ht 69.0 in | Wt 139.0 lb

## 2017-05-28 DIAGNOSIS — R0600 Dyspnea, unspecified: Secondary | ICD-10-CM | POA: Diagnosis not present

## 2017-05-28 DIAGNOSIS — J849 Interstitial pulmonary disease, unspecified: Secondary | ICD-10-CM

## 2017-05-28 DIAGNOSIS — J84112 Idiopathic pulmonary fibrosis: Secondary | ICD-10-CM

## 2017-05-28 LAB — CBC WITH DIFFERENTIAL/PLATELET
BASOS ABS: 0.1 10*3/uL (ref 0.0–0.1)
Basophils Relative: 0.6 % (ref 0.0–3.0)
Eosinophils Absolute: 0.3 10*3/uL (ref 0.0–0.7)
Eosinophils Relative: 1.9 % (ref 0.0–5.0)
HCT: 41.1 % (ref 39.0–52.0)
HEMOGLOBIN: 13.5 g/dL (ref 13.0–17.0)
LYMPHS ABS: 1.8 10*3/uL (ref 0.7–4.0)
LYMPHS PCT: 13.4 % (ref 12.0–46.0)
MCHC: 32.8 g/dL (ref 30.0–36.0)
MCV: 102 fl — AB (ref 78.0–100.0)
MONOS PCT: 7.9 % (ref 3.0–12.0)
Monocytes Absolute: 1.1 10*3/uL — ABNORMAL HIGH (ref 0.1–1.0)
NEUTROS PCT: 76.2 % (ref 43.0–77.0)
Neutro Abs: 10.4 10*3/uL — ABNORMAL HIGH (ref 1.4–7.7)
Platelets: 317 10*3/uL (ref 150.0–400.0)
RBC: 4.03 Mil/uL — AB (ref 4.22–5.81)
RDW: 15.1 % (ref 11.5–15.5)
WBC: 13.6 10*3/uL — AB (ref 4.0–10.5)

## 2017-05-28 LAB — COMPREHENSIVE METABOLIC PANEL
ALBUMIN: 3 g/dL — AB (ref 3.5–5.2)
ALK PHOS: 46 U/L (ref 39–117)
ALT: 9 U/L (ref 0–53)
AST: 15 U/L (ref 0–37)
BILIRUBIN TOTAL: 0.4 mg/dL (ref 0.2–1.2)
BUN: 17 mg/dL (ref 6–23)
CO2: 29 mEq/L (ref 19–32)
Calcium: 7.9 mg/dL — ABNORMAL LOW (ref 8.4–10.5)
Chloride: 104 mEq/L (ref 96–112)
Creatinine, Ser: 1.29 mg/dL (ref 0.40–1.50)
GFR: 56.07 mL/min — AB (ref >60.00)
GLUCOSE: 65 mg/dL — AB (ref 70–99)
Potassium: 3.8 mEq/L (ref 3.5–5.1)
Sodium: 141 mEq/L (ref 135–145)
TOTAL PROTEIN: 6.1 g/dL (ref 6.0–8.3)

## 2017-05-28 LAB — BRAIN NATRIURETIC PEPTIDE: PRO B NATRI PEPTIDE: 116 pg/mL — AB (ref 0.0–100.0)

## 2017-05-28 NOTE — Patient Instructions (Signed)
Idiopathic pulmonary fibrosis: I am concerned that this is worsening based on the fact that she need to use more oxygen and your 6-minute walk distance is decreased We will repeat a lung function test We will repeat a high-resolution CT scan of the chest Continue 7fev, I will talk to my partner about adding on another drug called Esbriet Continue pulmonary rehab, we will reorder this  Shortness of breath: While I think this is all due to IPF, I will check some blood work to look for evidence of fluid retention and perform the tests above to make sure there is nothing else going on  Chronic respiratory failure with hypoxemia: Continue 4 L of oxygen at rest, 3 L while sleeping, and 8-10 L with exercise  We will see you back in 3-4 weeks

## 2017-05-28 NOTE — Progress Notes (Signed)
SIX MIN WALK 05/28/2017 01/07/2017 11/12/2016 09/24/2016 06/19/2016 04/23/2016 10/21/2015  Medications Lipitor All the following medications were taken at 0730 this morning: Liptior 20mg , Catapress 0.1mg , Enalapril-HCTZ 5-12.5mg , Ofev 150mg , Dorzolamide-Timolol (mg not documented in chart) atorvastatin 20mg , enalapril 5-12.5mg ,ofev 150mg  @8am --aspirin, lipitor,vit D3, lumigan,xalatan, catapress, Cq10, enalapril/HCTZ lipitor 20mg , CoQ10 100mg , Enalapril-HCTZ 5/12.5mg  --- ALL at 7am ASA 81, Lipitor 20mg , Lumigan .01%, Vit D3 2000IU, COQ10 100mg , Enalapril-HCTZ 5/12.5mg , Xalatan .005% ---ALL at 8am Lipitor, Vitamin D3, CoQ10, Enalapril  Supplimental Oxygen during Test? (L/min) Yes Yes Yes Yes No No No  O2 Flow Rate 10 3 4 3  - - -  Type Continuous Pulse - Continuous - - -  Laps 3 4 5 5 7 7 6   Partial Lap (in Meters) 0 6 0 24 0 18 36  Baseline BP (sitting) 110/60 120/76 132/78 144/86 162/78 142/80 158/96  Baseline Heartrate 95 52 75 76 64 56 76  Baseline Dyspnea (Borg Scale) 0 0 0 0 0 0 0  Baseline Fatigue (Borg Scale) 1 0 0 0 0 0 0  Baseline SPO2 97 97 97 96 93 96 95  BP (sitting) 122/88 138/72 138/82 140/80 160/62 162/84 168/98  Heartrate 124 139 146 71 74 73 110  Dyspnea (Borg Scale) 6 1 3 1 1 1  0  Fatigue (Borg Scale) 6 1 2  0 0.5 0.5 0  SPO2 90 89 92 95 83 83 88  BP (sitting) 112/70 122/72 134/74 128/70 158/80 130/74 150/84  Heartrate 94 73 68 62 58 70 84  SPO2 96 97 100 99 92 90 94  Stopped or Paused before Six Minutes No Yes No Yes No No No  Other Symptoms at end of Exercise - See Tech Comments  - stopped the clock at 4:08 due to oxygen levels at 85% room air.  placed on 2 liters via [place], pt up to 97%.  stopped the clock at 1;43 due to 88% on 2 liters, increased to 3 liters and pt completed the test without difficulty.  pt stated that he does not wear any oxygen during the day or at night.  pt did not feel winded during the test.  - - -  Distance Completed 144 198 240 264 336 354 324  Tech Comments: -  Pt desat walking back to do the walk on RA. Pt was started on 3lpm pulsed and carried his on POC during walk. Pt desat to 88% HR 108, time was paused with 2:47 left of the walk. Pt's O2 was increased to 4lpm pulsed and continued the walk at this liter flow.  Pt was walked a slow steady pace, nurse noticed while montioring pt during walk heart rate bouncing up and down from 140's and lower, Pt c/o dyspnea during and after walk. He was able to complete his whole walk and seemed to tolerate the walk well  pt did well during test.  did not feel tired, SOB during the test, even though his sats did drop down to 85% on room air.   Pt completed 6MW test and did desat, pt was titrated on O2 for an additional 6 mins >> O2 maintained above 90% on 2 liters O2. (see patient care coordination notes)--amg pt walked at a normal pace(per pt)- desat to 83% which patient recovered to 90% after 2 minute resting period. pt rested for a total of 10 minutes and then was titrated on O2 for 6 minutes with the nurse. Pt started walk at 98% room air and desated to 84% by the  end of 1 lab around the office. pt was walked for a total of 6 minutes and O2 was titrated up to 3 Liters ending test at 90%. See patient care coordination notes for O2 saturations. --amg -

## 2017-05-28 NOTE — Progress Notes (Signed)
Subjective:   PATIENT ID: Casey [name] GENDER: male DOB: 09-15-1928, MRN: [id number]  Synopsis: Referred in 2017 originally to Dr. Ashok Cordia for IPF.  He never smoked.  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  He has been on Ofev since 2017.   HPI  Chief Complaint  Patient presents with  . Follow-up    former JN pt being treated for IPF.  review today's 74m.  pt states he is doing well as long as he wears his O2.   Mr. SJeffhas been taking Ofev for about a year now, maybe a little longer.  He has been participating in pulmonary rehab. He says that he doesn't see much change in his disease. He says that as long as he sits still in a chair his oxygen will drop as low as 85% on room air.   He uses 10 L O2 while exerting himself at rehab.   He uses 4Lpm at rest, he will use 8 Lpm. He says that he is worse than he was a year ago because his oxygen level drops at rest now, he couldn't do this a year ago.   He sleeps on 3L pm  No morning headache. No chest congestion, no wheezing. When he gets short of breath its just because he can't get enough air. He might cough a little in the morning.   No problems from the Ofev, just very mild diarrhea which is rare.  No rash. He has been slowly losing weight in the last year.  Past Medical History:  Diagnosis Date  . Carotid artery stenosis   . GERD (gastroesophageal reflux disease)   . Glaucoma   . Hyperlipemia   . Hypertension      Family History  Problem Relation Age of Onset  . Heart disease Father   . Heart disease Brother   . Diabetes Brother   . Prostate cancer Brother   . Heart disease Sister   . Diabetes Sister   . Heart disease Other   . Lung disease Neg Hx   . Rheumatologic disease Neg Hx      Social History   Socioeconomic History  . Marital status: Married    Spouse name: Not on file  . Number of children: Not on file  . Years of education: Not on file  . Highest education level: Not on file    Social Needs  . Financial resource strain: Not on file  . Food insecurity - worry: Not on file  . Food insecurity - inability: Not on file  . Transportation needs - medical: Not on file  . Transportation needs - non-medical: Not on file  Occupational History  . Occupation: Retired    Comment: HCustomer service manager Tobacco Use  . Smoking status: Never Smoker  . Smokeless tobacco: Never Used  . Tobacco comment: chews on cigars occasionally  Substance and Sexual Activity  . Alcohol use: Yes    Alcohol/week: 0.0 oz    Comment: social  . Drug use: No  . Sexual activity: Not on file  Other Topics Concern  . Not on file  Social History Narrative   [place] Previously worked tEducation administrator Currently has a dog. No bird, mold, asbestos, or hot tub exposure. Patient does have a pool. Enjoys doing yard work. Remote exposure to silage and hay. Also worked with cattle.  No Known Allergies   Outpatient Medications Prior to Visit  Medication Sig Dispense Refill  . aspirin EC 81 MG tablet Take 1 tablet by mouth daily.    Marland Kitchen atorvastatin (LIPITOR) 20 MG tablet Take 1 tablet by mouth daily.    . Cholecalciferol (VITAMIN D3) 2000 units TABS Take by mouth daily.    . cloNIDine (CATAPRES) 0.1 MG tablet Take 1 tablet by mouth at bedtime.    . Coenzyme Q10 (CO Q-10) 100 MG CAPS Take by mouth daily.    . Dorzolamide HCl-Timolol Mal (COSOPT OP) daily.    . Enalapril-Hydrochlorothiazide 5-12.5 MG tablet Take 1 tablet by mouth every morning.    . escitalopram (LEXAPRO) 10 MG tablet Take 10 mg by mouth daily.    Marland Kitchen latanoprost (XALATAN) 0.005 % ophthalmic solution 2 (two) times daily.    . Nintedanib (OFEV) 150 MG CAPS Take 150 mg by mouth 2 (two) times daily.    . pantoprazole (PROTONIX) 40 MG tablet Take 1 tablet by mouth daily.    Marland Kitchen LUMIGAN 0.01 % SOLN daily.  10   No facility-administered medications prior to  visit.     ROS    Objective:  Physical Exam   Vitals:   05/28/17 1032  BP: 124/64  Pulse: (!) 105  SpO2: 92%  Weight: 139 lb (63 kg)  Height: 5' 9"  (1.753 m)    Gen: chronically ill appearing HENT: OP clear, TM's clear, neck supple PULM: Few crackles bases B, normal percussion CV: RRR, no mgr, trace edema GI: BS+, soft, nontender Derm: no cyanosis or rash Psyche: normal mood and affect   CBC No results found for: WBC, RBC, HGB, HCT, PLT, MCV, MCH, MCHC, RDW, LYMPHSABS, MONOABS, EOSABS, BASOSABS   PFT 01/07/17: FVC 2.72 L (81%) FEV1 2.31 L (100%) FEV1/FVC 0.85 FEF 25-75 2.60 L (180%) DLCO corrected 19% 09/24/16: FVC 3.02 L (89%) FEV1 2.56 L (110%) FEV1/FVC 0.85 FEF 25-75 3.81 L (260%) DLCO corrected 20% 06/19/16: FVC 3.02 L (89%) FEV1 2.48 L (107%) FEV1/FVC 0.82 FEF 25-75 3.26 L (221%) DLCO corrected 27% 04/23/16: FVC 2.70 L (82%) FEV1 2.38 L (102%) FEV1/FVC 0.86 FEF 25-75 2.98 L (201%) DLCO corrected 31% (hgb 14.6)  6MWT 05/2017:  166m O2 saturation nadir 90% 01/07/17:  Walked 198 meters / Baseline Sat 97% on 3 L/m pulse / Nadir Sat 88% on 3 L/m pulse @ 3:13 (required 4 L/m pulse with exertion) 11/12/16:  Walked 240 meters / Baseline Sat 97% on 4 L/m / Nadir Sat 92% on 4 L/m @ end of test (HR increased to 140bpm during test) 09/24/16:  Walked 264 meters / Baseline Sat 96% on RA / Nadir Sat 85% on RA @ 4:08 (required 3 L/m to maintain with exertion) 06/19/16:  Walked 336 meters / Baseline Sat 93% on RA / Nadir Sat 83% on RA @ end of test (required 2 L/m to maintain with repeat walk) 04/23/16:  Walked 354 meters / Baseline Sat 96% on RA / Nadir Sat 83% on RA @ end of test (required 3 L/m to maintain with repeat walk) 10/21/15:  Walked 324 meters / Baseline Sat 95% on RA / Nadir Sat 88% on RA @ end of test  IMAGING CT CHEST W/O 02/27/16:  Bilateral intralobular septal thickening and fibrotic changes with traction bronchiectasis relatively unchanged when compared with  previous CT imaging. Mediastinal adenopathy persists with largest lymph node is subcarinal measuring approximately 1.6 cm in short axis. No pleural effusion or thickening. No pericardial effusion.  No developing nodule or mass.  HRCT CHEST W/O 09/27/15:  No pericardial effusion. No pleural effusion or thickening. Traction bronchiectasis with subpleural and primary bronchovascular reticular changes as well as groundglass. Involvement of both apical and basilar regions. Early honeycomb changes. Small to moderate hiatal hernia. Calcifications noted within the liver and spleen. No axillary adenopathy. Enlarged mediastinal lymph nodes measuring up to 1.8 cm in short axis present.  CT CHEST/ABD/PELVIS W/ 08/19/15: Mediastinal lymphadenopathy noted with the largest lymph node measuring 1.6 cm at level 7, 1.3 cm 4L, & 1.1 cm 4R positions. Calcifications noted within the spleen and liver suggestive of prior granulomatous disease. No pleural effusion or thickening. Patchy bilateral intralobular septal thickening and reticulation consistent with interstitial lung disease. This appears both centrally in the lungs as well as peripherally occurring throughout all fields with slightly less involvement in the upper lung zones. Suggestion of traction bronchiectasis. No obvious groundglass opacities. There is also suggestion of honeycomb changes. Remote T-8 compression fracture with advanced anterior height loss. Mild finding enlargement of left inguinal canal. Renal cysts on the left noted with mild renal atrophy and no hydronephrosis. Enlarged central prostate that is symmetric. Colonic diverticulosis noted. No appendicitis. No mass or adenopathy. No ascites or pneumoperitoneum.  PORT CXR 02/01/09: Heart appears to be largely normal in size and unchanged when compared with previous portable chest x-ray from 2009. Mediastinum normal in contour. No obvious parenchymal opacities or increased interstitial markings. No pleural  effusion.   LABS 10/17/16 CBC: 10.7/13.9/41.8/236 BMP: 135/4.3/104/22/19/1.19/83/8.5 LFT: 3.4/6.3/0.6/61/19/18  10/21/15 Anti-CCP:  <16 RF:  <10 Chromatin antibody:  <0.2 Centromere antibody screen:  <1.0 Double-stranded DNA antibody: 1 ENA Smith antibody:  <1 SSA:   <1.0 SSB:   <1.0 SCL 70:   <1.0  09/22/15 ProBNP: 97.0 CRP: 0.5 ESR: 16 ANA: Negative Hypersensitivity Pneumonitis Panel: Negative   08/15/15 CBC: 9.1/14.2/43.2/239 BMP: 1:30/3.9/105/24/20/1.43/98/9.0 LFT: 3.8/6.7/0.4/73/19/20 ESR: 11 CRP: 5.5       Assessment & Plan:   Dyspnea, unspecified type - Plan: Comp Met (CMET), CBC w/Diff, B Nat Peptide  Interstitial pulmonary disease ([place]) - Plan: CT Chest High Resolution, Pulmonary function test, AMB referral to pulmonary rehabilitation, Ambulatory Referral for DME  IPF (idiopathic pulmonary fibrosis) ([place])  Discussion: I am concerned about Mr. Docia Furl.  His oxygenation, 6-minute walk distance of all worsened over the last several months.  This is consistent with the natural history of idiopathic pulmonary fibrosis.  He is compliant with pulmonary rehab and with oxygen and Ofev. I do not see signs or symptoms of an alternative diagnosis based on his physical exam or history.  It is unclear to me if adding Esbriet would be effective, I do not think we have clinical trial data on this back yet..  Idiopathic pulmonary fibrosis: I am concerned that this is worsening based on the fact that she need to use more oxygen and your 6-minute walk distance is decreased We will repeat a lung function test We will repeat a high-resolution CT scan of the chest Continue 52fv, I will talk to my partner about adding on another drug called Esbriet Continue pulmonary rehab, we will reorder this  Shortness of breath: While I think this is all due to IPF, I will check some blood work to look for evidence of fluid retention and perform the tests above to make sure there is  nothing else going on (cbc, cmet, bnp)  Chronic respiratory failure with hypoxemia: Continue 4 L of oxygen at rest, 3 L while sleeping, and  8-10 L with exercise  We will see you back in 3-4 weeks  > 50% of this 45 minute visit spent face to face    Current Outpatient Medications:  .  aspirin EC 81 MG tablet, Take 1 tablet by mouth daily., Disp: , Rfl:  .  atorvastatin (LIPITOR) 20 MG tablet, Take 1 tablet by mouth daily., Disp: , Rfl:  .  Cholecalciferol (VITAMIN D3) 2000 units TABS, Take by mouth daily., Disp: , Rfl:  .  cloNIDine (CATAPRES) 0.1 MG tablet, Take 1 tablet by mouth at bedtime., Disp: , Rfl:  .  Coenzyme Q10 (CO Q-10) 100 MG CAPS, Take by mouth daily., Disp: , Rfl:  .  Dorzolamide HCl-Timolol Mal (COSOPT OP), daily., Disp: , Rfl:  .  Enalapril-Hydrochlorothiazide 5-12.5 MG tablet, Take 1 tablet by mouth every morning., Disp: , Rfl:  .  escitalopram (LEXAPRO) 10 MG tablet, Take 10 mg by mouth daily., Disp: , Rfl:  .  latanoprost (XALATAN) 0.005 % ophthalmic solution, 2 (two) times daily., Disp: , Rfl:  .  Nintedanib (OFEV) 150 MG CAPS, Take 150 mg by mouth 2 (two) times daily., Disp: , Rfl:  .  pantoprazole (PROTONIX) 40 MG tablet, Take 1 tablet by mouth daily., Disp: , Rfl:

## 2017-05-29 ENCOUNTER — Other Ambulatory Visit: Payer: Self-pay | Admitting: Pulmonary Disease

## 2017-05-29 DIAGNOSIS — R7989 Other specified abnormal findings of blood chemistry: Secondary | ICD-10-CM

## 2017-05-29 DIAGNOSIS — R0602 Shortness of breath: Secondary | ICD-10-CM

## 2017-05-29 DIAGNOSIS — J84112 Idiopathic pulmonary fibrosis: Secondary | ICD-10-CM

## 2017-05-29 DIAGNOSIS — J449 Chronic obstructive pulmonary disease, unspecified: Secondary | ICD-10-CM | POA: Diagnosis not present

## 2017-05-29 DIAGNOSIS — J849 Interstitial pulmonary disease, unspecified: Secondary | ICD-10-CM

## 2017-05-30 ENCOUNTER — Telehealth: Payer: Self-pay | Admitting: Pharmacy Technician

## 2017-05-30 ENCOUNTER — Telehealth: Payer: Self-pay | Admitting: Pulmonary Disease

## 2017-05-30 DIAGNOSIS — I700 Atherosclerosis of aorta: Secondary | ICD-10-CM | POA: Diagnosis not present

## 2017-05-30 DIAGNOSIS — I2510 Atherosclerotic heart disease of native coronary artery without angina pectoris: Secondary | ICD-10-CM | POA: Diagnosis not present

## 2017-05-30 DIAGNOSIS — J849 Interstitial pulmonary disease, unspecified: Secondary | ICD-10-CM | POA: Diagnosis not present

## 2017-05-30 DIAGNOSIS — K449 Diaphragmatic hernia without obstruction or gangrene: Secondary | ICD-10-CM | POA: Diagnosis not present

## 2017-05-30 NOTE — Telephone Encounter (Signed)
Called pt letting him know the date and time of the echocardiogram. Pt expressed understanding. Nothing further needed at this time.

## 2017-05-31 DIAGNOSIS — J449 Chronic obstructive pulmonary disease, unspecified: Secondary | ICD-10-CM | POA: Diagnosis not present

## 2017-05-31 NOTE — Telephone Encounter (Signed)
error 

## 2017-06-05 DIAGNOSIS — J449 Chronic obstructive pulmonary disease, unspecified: Secondary | ICD-10-CM | POA: Diagnosis not present

## 2017-06-06 ENCOUNTER — Telehealth: Payer: Self-pay | Admitting: Pulmonary Disease

## 2017-06-06 DIAGNOSIS — J849 Interstitial pulmonary disease, unspecified: Secondary | ICD-10-CM

## 2017-06-06 NOTE — Telephone Encounter (Signed)
Per patient's chart, [facility] performed the test. It was ordered under JN's name.   Will need to call [facility] in the morning to get the results faxed to [place] so BQ can review them.   Spoke with patient and advised him that we will call [facility] in the morning to get his results. He verbalized understanding.

## 2017-06-07 ENCOUNTER — Ambulatory Visit (HOSPITAL_COMMUNITY): Payer: Medicare Other | Attending: Internal Medicine

## 2017-06-07 DIAGNOSIS — R0602 Shortness of breath: Secondary | ICD-10-CM | POA: Diagnosis not present

## 2017-06-07 DIAGNOSIS — E785 Hyperlipidemia, unspecified: Secondary | ICD-10-CM | POA: Insufficient documentation

## 2017-06-07 DIAGNOSIS — I10 Essential (primary) hypertension: Secondary | ICD-10-CM | POA: Diagnosis not present

## 2017-06-07 NOTE — Telephone Encounter (Signed)
Called [facility] and they will fax over ONO results. Will await fax.

## 2017-06-10 DIAGNOSIS — J449 Chronic obstructive pulmonary disease, unspecified: Secondary | ICD-10-CM | POA: Diagnosis not present

## 2017-06-12 DIAGNOSIS — J449 Chronic obstructive pulmonary disease, unspecified: Secondary | ICD-10-CM | POA: Diagnosis not present

## 2017-06-12 NOTE — Telephone Encounter (Signed)
Called and spoke to pt's wife, Danton Clap letting her know that pt does need to wear O2 at night based off of the ONO results.  Also told her we were going to repeat the ONO with him wearing the 2L at night to make sure the 2L would be sufficient for pt to keep doing at night.  [name] expressed understanding. Order placed for ONO to be done on 2L O2.  Nothing further needed at this current time.

## 2017-06-12 NOTE — Telephone Encounter (Signed)
05/2017 ONO RA < 88% more than 83% of the night (7 hours) He needs to sleep on 2L [place] and we will repeat an ONO on that

## 2017-06-12 NOTE — Telephone Encounter (Signed)
Spoke with BQ. He states that he review pt's CT and it is worse since his last CT. Pt's ONO results were sent to [facility] in basket per BQ but I can't locate them.  Called and spoke with pt's wife. She is aware of the pt's Echo and CT results per BQ. Advised pt's wife that we would speak with BQ about the ONO and someone would return her call about the ONO results.  BQ - please advise on ONO. Thanks.

## 2017-06-12 NOTE — Telephone Encounter (Signed)
Patient's wife, Danton Clap, is calling to see if any results are back on ONO, echo, and CT.  CB is [phone].

## 2017-06-13 ENCOUNTER — Telehealth: Payer: Self-pay | Admitting: Pulmonary Disease

## 2017-06-13 NOTE — Telephone Encounter (Signed)
I called pt;s wife, but no answer and I could not leave message.

## 2017-06-14 DIAGNOSIS — J449 Chronic obstructive pulmonary disease, unspecified: Secondary | ICD-10-CM | POA: Diagnosis not present

## 2017-06-14 NOTE — Telephone Encounter (Signed)
Spoke with Casey [name], she states they did not send his medication because we have to send information to re-authorize his application to receive Financial assistance with OFEV.   [facility]

## 2017-06-14 NOTE — Telephone Encounter (Signed)
Patient's daughter in law, Rise Paganini, calling about the same issue, CB is [phone].

## 2017-06-14 NOTE — Telephone Encounter (Signed)
Spoke with [employer] and they stated pt needed a PA for OFEV 150 mg. I completed PA on CMM. Will await response from [facility]. I called Rise Paganini to let her know, she verbalized understanding. Will route AC box for follow up,   ID [id number]   Arsen Burkemper (Key: [id number])  Your information has been submitted to [place]. [employer] [place] will review the request and notify you of the determination decision directly, typically within 3 business days of your submission and once all necessary information is received.  You will also receive your request decision electronically. To check for an update later, open the request again from your dashboard.  If [employer] [place] has not responded within the specified timeframe or if you have any questions about your PA submission, contact [place] directly at [facility]) [phone] or ([place]) [phone].  I called Jamie at [employer] to check on authorization at [phone] ext [phone] and left a detailed message asking her to call back.

## 2017-06-14 NOTE — Telephone Encounter (Signed)
Pt daughter in law Rise Paganini  Is calling back about his OFEV     [phone]

## 2017-06-17 DIAGNOSIS — J449 Chronic obstructive pulmonary disease, unspecified: Secondary | ICD-10-CM | POA: Diagnosis not present

## 2017-06-17 NOTE — Telephone Encounter (Signed)
Checked Cover My Meds. OFEV PA has been denied.  BQ - please advise if you like an appeal done. Thanks.

## 2017-06-17 NOTE — Telephone Encounter (Signed)
Rise Paganini, daughter-in-law, calling about status of Ofev.  CB is [phone].

## 2017-06-17 NOTE — Telephone Encounter (Signed)
Spoke with Hassan Rowan, advised her that the medication has been denied and we are waiting to see if BQ wants to file an appeal. She verbalized understanding.

## 2017-06-18 NOTE — Telephone Encounter (Signed)
Received a denial for a tier exception and not a PA.  Verified via key listed below that a tier exception was initiated and denied, not a PA.  Called [facility] at [phone] to initiate urgent PA for Ofev- rep was unable to find pt under medicare ID or name and DOB.  Was advised by rep to call and verify his rx coverage.    lmtcb X1 for daughter Rise Paganini to verify pt's rx insurance info.

## 2017-06-18 NOTE — Telephone Encounter (Signed)
Called and spoke with wife Casey [name]. She states that patient has had the same [facility] card since 2008.   ID number on card is [id number] Group number on card [phone]  Patients wife also states that there is not a PCN or BIN number on card but the patient does have a drug card.  ID on drug card is [id number]

## 2017-06-18 NOTE — Telephone Encounter (Signed)
Dickey Gave has been approved from 06/18/17-06/18/2018.  Will continue to wait on [employer] paperwork

## 2017-06-18 NOTE — Telephone Encounter (Signed)
Rise Paganini, [phone], calling stating patient has now been out of Ofev for 3 days and wants to know if this is something he should have tapered off of? Also has some more questions about denial and next process.

## 2017-06-18 NOTE — Telephone Encounter (Signed)
Rise Paganini, [place] is [phone].  States [employer] is through [place], states does not have number in front of her. The patient's wife, Danton Clap, should have the [employer] ins information and may call her at [phone]. Beverly states [employer] has sent over a fax this am for BQ to fill out and fax back to them with the RX. States they have an open file in order to apply for this.  States this was done last year also.

## 2017-06-18 NOTE — Telephone Encounter (Signed)
PA for ofev initiated through covermymeds. The key is [id number]. A determination expected within 3 days. Form has not yet been received from [employer]. Will look for fax to come through.

## 2017-06-19 DIAGNOSIS — J449 Chronic obstructive pulmonary disease, unspecified: Secondary | ICD-10-CM | POA: Diagnosis not present

## 2017-06-20 DIAGNOSIS — J849 Interstitial pulmonary disease, unspecified: Secondary | ICD-10-CM | POA: Diagnosis not present

## 2017-06-21 DIAGNOSIS — J449 Chronic obstructive pulmonary disease, unspecified: Secondary | ICD-10-CM | POA: Diagnosis not present

## 2017-06-24 ENCOUNTER — Telehealth: Payer: Self-pay | Admitting: Pulmonary Disease

## 2017-06-24 DIAGNOSIS — J449 Chronic obstructive pulmonary disease, unspecified: Secondary | ICD-10-CM | POA: Diagnosis not present

## 2017-06-24 NOTE — Telephone Encounter (Signed)
Spoke with patients wife, she is aware of approval with medication. Nothing further needed.

## 2017-06-24 NOTE — Telephone Encounter (Signed)
Pt is calling back [phone]

## 2017-06-24 NOTE — Telephone Encounter (Signed)
Called and spoke with patient, he states that he has trouble hearing and he will have his wife call us back regarding this once she gets home. Will leave open for follow up.

## 2017-06-26 DIAGNOSIS — J449 Chronic obstructive pulmonary disease, unspecified: Secondary | ICD-10-CM | POA: Diagnosis not present

## 2017-06-27 ENCOUNTER — Telehealth: Payer: Self-pay

## 2017-06-27 NOTE — Telephone Encounter (Signed)
Spoke with pt's spouse, aware of results/recs.  Nothing further needed.  

## 2017-06-27 NOTE — Telephone Encounter (Signed)
-----   Message from Juanito Doom, MD sent at 06/26/2017  5:17 PM EST ----- A, Please let the patient know the ONO was OK Thanks, B

## 2017-06-28 ENCOUNTER — Telehealth: Payer: Self-pay | Admitting: Pulmonary Disease

## 2017-06-28 DIAGNOSIS — J449 Chronic obstructive pulmonary disease, unspecified: Secondary | ICD-10-CM | POA: Diagnosis not present

## 2017-06-28 NOTE — Telephone Encounter (Signed)
Spoke with [employer]. Advised her that we have the forms sent from [place] and that I would fax them in today for the patient since he is completely out of his medication. She verbalized understanding. Nothing else needed at time of call.

## 2017-06-28 NOTE — Telephone Encounter (Signed)
Left message for Rise Paganini to call back.   Checked both fax machines and did not see anything for this patient.

## 2017-06-28 NOTE — Telephone Encounter (Signed)
Rise Paganini called back- advised that we still haven't received a fax. She will call them back and have them re-fax. She can be reached at [phone] -pr

## 2017-07-01 ENCOUNTER — Telehealth: Payer: Self-pay | Admitting: Pulmonary Disease

## 2017-07-01 DIAGNOSIS — J449 Chronic obstructive pulmonary disease, unspecified: Secondary | ICD-10-CM | POA: Diagnosis not present

## 2017-07-01 NOTE — Telephone Encounter (Signed)
Rise Paganini calling back for an update. Cb is [phone]

## 2017-07-01 NOTE — Telephone Encounter (Signed)
Spoke with Heather at [employer]. She stated that they did receive the forms from earlier today. Patient's paperwork has been sent to the data entry center for processing. Per Nira Conn, patient should receive a call by this Thursday to setup a shipment date for his Ofev.   Spoke with Rise Paganini, she is aware of the status. She will await the call from [place] for the shipment date.   Will place the application back in BQ's cubby for any future reference.

## 2017-07-01 NOTE — Telephone Encounter (Signed)
Spoke with [employer]. Advised that I called [employer] this morning to check on status of patient's Ofev. Brandy from [facility] stated that they needed BQ's signature on another part of the form that was faxed over on 2/15. I have stamped and faxed the form back to [facility]. Per Theadora Rama, this is only thing holding the patient back from receiving his Ofev.   Advised her that I will call SP back this afternoon to see if they have received this information and can provide [place] with a shipment date. Beverly verbalized understanding.

## 2017-07-10 ENCOUNTER — Encounter: Payer: Self-pay | Admitting: Pulmonary Disease

## 2017-07-10 ENCOUNTER — Ambulatory Visit: Payer: Medicare Other

## 2017-07-10 ENCOUNTER — Ambulatory Visit (INDEPENDENT_AMBULATORY_CARE_PROVIDER_SITE_OTHER): Payer: Medicare Other

## 2017-07-10 ENCOUNTER — Ambulatory Visit (INDEPENDENT_AMBULATORY_CARE_PROVIDER_SITE_OTHER): Payer: Medicare Other | Admitting: Pulmonary Disease

## 2017-07-10 VITALS — BP 138/66 | HR 83 | Temp 97.8°F | Ht 69.0 in | Wt 138.0 lb

## 2017-07-10 DIAGNOSIS — J84112 Idiopathic pulmonary fibrosis: Secondary | ICD-10-CM | POA: Diagnosis not present

## 2017-07-10 DIAGNOSIS — J849 Interstitial pulmonary disease, unspecified: Secondary | ICD-10-CM

## 2017-07-10 DIAGNOSIS — I509 Heart failure, unspecified: Secondary | ICD-10-CM | POA: Diagnosis not present

## 2017-07-10 DIAGNOSIS — J9611 Chronic respiratory failure with hypoxia: Secondary | ICD-10-CM

## 2017-07-10 NOTE — Progress Notes (Signed)
Subjective:   PATIENT ID: Casey [name] GENDER: male DOB: [birth year], MRN: [id number]  Synopsis: Referred in 2017 originally to Dr. Ashok Cordia for IPF.  He never smoked.  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  He has been on Ofev since 2017.   HPI  Chief Complaint  Patient presents with  . Follow-up    c/o cold symptoms for the past wk- nasal congestion and increased SOB. He states he usually only coughs in the am's- prod with clear sputum.     Casey [name] says he is "doing fine but he is short winded".  His wife notes that he hada  Cold about a week ago and he ended up with the same thing.  She had a flu test that was negative.  The symptoms are predominantly in his sinuses.  He had a lot of chest congestion.  He has used Vick's some.    He didn't think that he could handle a breathing test today.  He still does practice some breathing techniques at home.   He coughs up a little mucus in the mornings, it is clear.  No fever or chills lately.   His O2 is set on 4 at rest, he turns it up to 8 when he walks around, 10L with physical.    He continues to have a lot of dyspnea.  Past Medical History:  Diagnosis Date  . Carotid artery stenosis   . GERD (gastroesophageal reflux disease)   . Glaucoma   . Hyperlipemia   . Hypertension      Family History  Problem Relation Age of Onset  . Heart disease Father   . Heart disease Brother   . Diabetes Brother   . Prostate cancer Brother   . Heart disease Sister   . Diabetes Sister   . Heart disease Other   . Lung disease Neg Hx   . Rheumatologic disease Neg Hx      Social History   Socioeconomic History  . Marital status: Married    Spouse name: Not on file  . Number of children: Not on file  . Years of education: Not on file  . Highest education level: Not on file  Social Needs  . Financial resource strain: Not on file  . Food insecurity - worry: Not on file  . Food insecurity - inability: Not on file  .  Transportation needs - medical: Not on file  . Transportation needs - non-medical: Not on file  Occupational History  . Occupation: Retired    Comment: Customer service manager  Tobacco Use  . Smoking status: Never Smoker  . Smokeless tobacco: Never Used  . Tobacco comment: chews on cigars occasionally  Substance and Sexual Activity  . Alcohol use: Yes    Alcohol/week: 0.0 oz    Comment: social  . Drug use: No  . Sexual activity: Not on file  Other Topics Concern  . Not on file  Social History Narrative   Originally from [place]. Has lived in [place], [place], [place], [place]. Remote travel to [place], [place], & [place]. Previously worked Education administrator. Currently has a dog. No bird, mold, asbestos, or hot tub exposure. Patient does have a pool. Enjoys doing yard work. Remote exposure to silage and hay. Also worked with cattle.      No Known Allergies   Outpatient Medications Prior to Visit  Medication Sig Dispense Refill  . aspirin EC 81 MG tablet Take 1 tablet by mouth daily.    Marland Kitchen  atorvastatin (LIPITOR) 20 MG tablet Take 1 tablet by mouth daily.    . Cholecalciferol (VITAMIN D3) 2000 units TABS Take by mouth daily.    . clonazePAM (KLONOPIN) 0.5 MG tablet Take 0.5 mg by mouth 2 (two) times daily as needed for anxiety.    . cloNIDine (CATAPRES) 0.1 MG tablet Take 1 tablet by mouth at bedtime.    . Coenzyme Q10 (CO Q-10) 100 MG CAPS Take by mouth daily.    . Dorzolamide HCl-Timolol Mal (COSOPT OP) daily.    . Enalapril-Hydrochlorothiazide 5-12.5 MG tablet Take 1 tablet by mouth every morning.    . escitalopram (LEXAPRO) 10 MG tablet Take 10 mg by mouth daily.    Marland Kitchen latanoprost (XALATAN) 0.005 % ophthalmic solution 2 (two) times daily.    . Nintedanib (OFEV) 150 MG CAPS Take 150 mg by mouth 2 (two) times daily.    . OXYGEN o2 4lpm with rest 8 with exertion [facility]    . pantoprazole (PROTONIX) 40 MG tablet Take 1 tablet by mouth daily.     No facility-administered medications prior to visit.     Review of  Systems  Constitutional: Positive for malaise/fatigue. Negative for chills and diaphoresis.  HENT: Positive for congestion.   Respiratory: Positive for cough. Negative for sputum production and wheezing.   Cardiovascular: Positive for leg swelling. Negative for chest pain and orthopnea.      Objective:  Physical Exam   Vitals:   07/10/17 0921  BP: 138/66  Pulse: 83  Temp: 97.8 F (36.6 C)  TempSrc: Oral  SpO2: 96%  Weight: 138 lb (62.6 kg)  Height: 5' 9"  (1.753 m)    Gen: well appearing HENT: OP clear, TM's clear, neck supple PULM: Coarse crackles bases B, normal percussion CV: RRR, no mgr, trace edema GI: BS+, soft, nontender Derm: no cyanosis or rash Psyche: normal mood and affect    CBC    Component Value Date/Time   WBC 13.6 (H) 05/28/2017 1151   RBC 4.03 (L) 05/28/2017 1151   HGB 13.5 05/28/2017 1151   HCT 41.1 05/28/2017 1151   PLT 317.0 05/28/2017 1151   MCV 102.0 (H) 05/28/2017 1151   MCHC 32.8 05/28/2017 1151   RDW 15.1 05/28/2017 1151   LYMPHSABS 1.8 05/28/2017 1151   MONOABS 1.1 (H) 05/28/2017 1151   EOSABS 0.3 05/28/2017 1151   BASOSABS 0.1 05/28/2017 1151     PFT 01/07/17: FVC 2.72 L (81%) FEV1 2.31 L (100%) FEV1/FVC 0.85 FEF 25-75 2.60 L (180%) DLCO corrected 19% 09/24/16: FVC 3.02 L (89%) FEV1 2.56 L (110%) FEV1/FVC 0.85 FEF 25-75 3.81 L (260%) DLCO corrected 20% 06/19/16: FVC 3.02 L (89%) FEV1 2.48 L (107%) FEV1/FVC 0.82 FEF 25-75 3.26 L (221%) DLCO corrected 27% 04/23/16: FVC 2.70 L (82%) FEV1 2.38 L (102%) FEV1/FVC 0.86 FEF 25-75 2.98 L (201%) DLCO corrected 31% (hgb 14.6)  6MWT 05/2017:  153m O2 saturation nadir 90% 01/07/17:  Walked 198 meters / Baseline Sat 97% on 3 L/m pulse / Nadir Sat 88% on 3 L/m pulse @ 3:13 (required 4 L/m pulse with exertion) 11/12/16:  Walked 240 meters / Baseline Sat 97% on 4 L/m / Nadir Sat 92% on 4 L/m @ end of test (HR increased to 140bpm during test) 09/24/16:  Walked 264 meters / Baseline Sat 96% on RA  / Nadir Sat 85% on RA @ 4:08 (required 3 L/m to maintain with exertion) 06/19/16:  Walked 336 meters / Baseline Sat 93% on RA / Nadir Sat 83%  on RA @ end of test (required 2 L/m to maintain with repeat walk) 04/23/16:  Walked 354 meters / Baseline Sat 96% on RA / Nadir Sat 83% on RA @ end of test (required 3 L/m to maintain with repeat walk) 10/21/15:  Walked 324 meters / Baseline Sat 95% on RA / Nadir Sat 88% on RA @ end of test  IMAGING CT chest January 2019 showed honeycombing some traction bronchiectasis scattered interstitial change consistent with UIP, images independently  CT CHEST W/O 02/27/16:  Bilateral intralobular septal thickening and fibrotic changes with traction bronchiectasis relatively unchanged when compared with previous CT imaging. Mediastinal adenopathy persists with largest lymph node is subcarinal measuring approximately 1.6 cm in short axis. No pleural effusion or thickening. No pericardial effusion. No developing nodule or mass.  HRCT CHEST W/O 09/27/15:  No pericardial effusion. No pleural effusion or thickening. Traction bronchiectasis with subpleural and primary bronchovascular reticular changes as well as groundglass. Involvement of both apical and basilar regions. Early honeycomb changes. Small to moderate hiatal hernia. Calcifications noted within the liver and spleen. No axillary adenopathy. Enlarged mediastinal lymph nodes measuring up to 1.8 cm in short axis present.  CT CHEST/ABD/PELVIS W/ 08/19/15: Mediastinal lymphadenopathy noted with the largest lymph node measuring 1.6 cm at level 7, 1.3 cm 4L, & 1.1 cm 4R positions. Calcifications noted within the spleen and liver suggestive of prior granulomatous disease. No pleural effusion or thickening. Patchy bilateral intralobular septal thickening and reticulation consistent with interstitial lung disease. This appears both centrally in the lungs as well as peripherally occurring throughout all fields with slightly less  involvement in the upper lung zones. Suggestion of traction bronchiectasis. No obvious groundglass opacities. There is also suggestion of honeycomb changes. Remote T-8 compression fracture with advanced anterior height loss. Mild finding enlargement of left inguinal canal. Renal cysts on the left noted with mild renal atrophy and no hydronephrosis. Enlarged central prostate that is symmetric. Colonic diverticulosis noted. No appendicitis. No mass or adenopathy. No ascites or pneumoperitoneum.  PORT CXR 02/01/09: Heart appears to be largely normal in size and unchanged when compared with previous portable chest x-ray from 2009. Mediastinum normal in contour. No obvious parenchymal opacities or increased interstitial markings. No pleural effusion.   Echocardiogram: January 2019 LVEF "may be moderately depressed, Doppler parameters consistent with grade 1 diastolic dysfunction, right ventricle moderately dilated, systolic function moderately reduced, right atrium mildly dilated  Overnight oxygen test: February 2019 time less than 88% 2.3 minutes on 2 L nasal cannula  LABS 10/17/16 CBC: 10.7/13.9/41.8/236 BMP: 135/4.3/104/22/19/1.19/83/8.5 LFT: 3.4/6.3/0.6/61/19/18  10/21/15 Anti-CCP:  <16 RF:  <10 Chromatin antibody:  <0.2 [facility] antibody screen:  <1.0 Double-stranded DNA antibody: 1 ENA Smith antibody:  <1 SSA:   <1.0 SSB:   <1.0 SCL 70:   <1.0  09/22/15 ProBNP: 97.0 CRP: 0.5 ESR: 16 ANA: Negative Hypersensitivity Pneumonitis Panel: Negative   08/15/15 CBC: 9.1/14.2/43.2/239 BMP: 1:30/3.9/105/24/20/1.43/98/9.0 LFT: 3.8/6.7/0.4/73/19/20 ESR: 11 CRP: 5.5       Assessment & Plan:   Congestive heart failure, unspecified HF chronicity, unspecified heart failure type ([facility]) - Plan: Ambulatory referral to Cardiology  IPF (idiopathic pulmonary fibrosis) ([facility] [facility])  Chronic respiratory failure with hypoxia ([place])  Discussion: As stated previously clot has worsened since last  year.  I would have like to obtain lung function testing today but he was not feeling up to it.  CT scanning of his chest did show some mild progression compared to the 2017 study and his 6-minute walk distance and oxygen  needs are all worse in the last 6 months.  I believe that this is likely due to some plus slow progression of his interstitial lung disease but his echocardiogram also showed possible dysfunction of his left ventricle.  I will refer him to cardiology to evaluate this further.  In regards to his pulmonary hypertension, this is due to his severe lung disease and he would not benefit from a pulmonary vasodilator.  Plan: Possible congestive heart failure: I will refer you to cardiology to assess this problem  Idiopathic pulmonary fibrosis: Continue Ofev twice a day We will obtain lab work on the next visit to make sure there is no problems from this medicine If you feel up to it on the next visit we will get a 6-minute walk  Chronic respiratory failure with hypoxemia: Continue 4 L at rest, 2 L with sleeping, and 8-10 L with exercise  We will see you back in 2 months or sooner if needed  Greater than 50% of this 28-minute visit spent face-to-face   Current Outpatient Medications:  .  aspirin EC 81 MG tablet, Take 1 tablet by mouth daily., Disp: , Rfl:  .  atorvastatin (LIPITOR) 20 MG tablet, Take 1 tablet by mouth daily., Disp: , Rfl:  .  Cholecalciferol (VITAMIN D3) 2000 units TABS, Take by mouth daily., Disp: , Rfl:  .  clonazePAM (KLONOPIN) 0.5 MG tablet, Take 0.5 mg by mouth 2 (two) times daily as needed for anxiety., Disp: , Rfl:  .  cloNIDine (CATAPRES) 0.1 MG tablet, Take 1 tablet by mouth at bedtime., Disp: , Rfl:  .  Coenzyme Q10 (CO Q-10) 100 MG CAPS, Take by mouth daily., Disp: , Rfl:  .  Dorzolamide HCl-Timolol Mal (COSOPT OP), daily., Disp: , Rfl:  .  Enalapril-Hydrochlorothiazide 5-12.5 MG tablet, Take 1 tablet by mouth every morning., Disp: , Rfl:  .   escitalopram (LEXAPRO) 10 MG tablet, Take 10 mg by mouth daily., Disp: , Rfl:  .  latanoprost (XALATAN) 0.005 % ophthalmic solution, 2 (two) times daily., Disp: , Rfl:  .  Nintedanib (OFEV) 150 MG CAPS, Take 150 mg by mouth 2 (two) times daily., Disp: , Rfl:  .  OXYGEN, o2 4lpm with rest 8 with exertion [facility], Disp: , Rfl:  .  pantoprazole (PROTONIX) 40 MG tablet, Take 1 tablet by mouth daily., Disp: , Rfl:

## 2017-07-10 NOTE — Patient Instructions (Signed)
Possible congestive heart failure: I will refer you to cardiology to assess this problem  Idiopathic pulmonary fibrosis: Continue Ofev twice a day We will obtain lab work on the next visit to make sure there is no problems from this medicine If you feel up to it on the next visit we will get a 6-minute walk  Chronic respiratory failure with hypoxemia: Continue 4 L at rest, 2 L with sleeping, and 8-10 L with exercise  We will see you back in 2 months or sooner if needed

## 2017-07-12 DIAGNOSIS — J449 Chronic obstructive pulmonary disease, unspecified: Secondary | ICD-10-CM | POA: Diagnosis not present

## 2017-07-15 DIAGNOSIS — J449 Chronic obstructive pulmonary disease, unspecified: Secondary | ICD-10-CM | POA: Diagnosis not present

## 2017-07-17 DIAGNOSIS — I10 Essential (primary) hypertension: Secondary | ICD-10-CM | POA: Insufficient documentation

## 2017-07-17 DIAGNOSIS — J449 Chronic obstructive pulmonary disease, unspecified: Secondary | ICD-10-CM | POA: Diagnosis not present

## 2017-07-17 DIAGNOSIS — I6529 Occlusion and stenosis of unspecified carotid artery: Secondary | ICD-10-CM | POA: Insufficient documentation

## 2017-07-17 DIAGNOSIS — E785 Hyperlipidemia, unspecified: Secondary | ICD-10-CM | POA: Insufficient documentation

## 2017-07-19 DIAGNOSIS — J449 Chronic obstructive pulmonary disease, unspecified: Secondary | ICD-10-CM | POA: Diagnosis not present

## 2017-07-22 DIAGNOSIS — J449 Chronic obstructive pulmonary disease, unspecified: Secondary | ICD-10-CM | POA: Diagnosis not present

## 2017-07-24 DIAGNOSIS — J449 Chronic obstructive pulmonary disease, unspecified: Secondary | ICD-10-CM | POA: Diagnosis not present

## 2017-07-24 NOTE — Progress Notes (Signed)
Cardiology Office Note:    Date:  07/25/2017   ID:  Casey [name], DOB 1928/08/13, MRN [id number]  PCP:  Myer Peer, MD  Cardiologist:  Shirlee More, MD   Referring MD: Myer Peer, MD  ASSESSMENT:    1. SOB (shortness of breath)   2. Right ventricular dysfunction   3. Essential hypertension   4. IPF (idiopathic pulmonary fibrosis) ([facility])   5. Chronic respiratory failure with hypoxia ([facility])   6. Shortness of breath   7. Abnormal EKG    PLAN:    In order of problems listed above:  1. Clinically I suspect the predominant problem here is lung disease but this certainly is at risk for RV dysfunction significant to his respiratory failure or coincident unrecognized left ventricular dysfunction although he exhibits none of the traditional markers of heart failure.  I think the best test to resolve the issue is a biventricular gated pool for ejection fraction and follow-up in the office with me afterwards.  At this time I would not place him on any diuretic or drug therapy for cardiomyopathy 2. Await results of gated pulse study for right ventricular ejection fraction 3. Stable blood pressure at target 4. Stable managed by pulmonary he is improved with oxygen therapy 5. See above 6. Await results a gated pool study for evidence of regional left ventricular dysfunction correlate with EKG and ejection fraction.  I think it be a very poor candidate to consider cardiac interventions.  Next appointment one month   Medication Adjustments/Labs and Tests Ordered: Current medicines are reviewed at length with the patient today.  Concerns regarding medicines are outlined above.  Orders Placed This Encounter  Procedures  . NM Cardiac [facility]   No orders of the defined types were placed in this encounter.   Chief Complaint  Patient presents with  . New Patient (Initial Visit)    per Dr [place] Bells to evaluate CHF  . Congestive Heart Failure    History of Present Illness:     Casey [name] is a 84 y.o. male with IPF who is being seen today for the evaluation of SoOBand concern of hreart failure at the request of Myer Peer, MD. Unfortunately he has developed severe lung disease chronic respiratory failure but he is improved with oxygen treatment but still short of breath with ADLs wears oxygen continuously.  He has no history of heart disease does not have orthopnea or edema no chest pain palpitation or syncope.  Echocardiogram was performed with poor quality because of his lung disease his right ventricle was described as enlarged and hypokinetic but markers of right ventricular function were normal and there is a question of left ventricular dysfunction.  To further resolve the question I asked him to undergo a gated pool study left and right ventricle with ejection fractions which should define if he is normal or abnormal.  If ejection fraction is significantly do reduced he would benefit from drug therapy to reduce progression and perhaps improve symptoms but I would not place him on diuretic therapy.  He will follow-up my office in 1 month after performance.  His resting tachycardia my office today and will do an EKG prior to leaving.  Past Medical History:  Diagnosis Date  . Carotid artery stenosis   . Chronic respiratory failure ([facility]) 06/01/2016   05/2017 ONO RA < 88% more than 83% of the night (7 hours)  . Edema 09/22/2015  . Essential hypertension 09/22/2015  . GERD (gastroesophageal reflux disease)   .  Glaucoma   . Hyperlipemia   . Hypertension   . ILD (interstitial lung disease) ([facility]) 08/19/2015  . IPF (idiopathic pulmonary fibrosis) ([facility]) 04/23/2016  . Mediastinal lymphadenopathy 09/22/2015  . Post-traumatic osteoarthritis, right ankle and [name] 09/18/2011    Past Surgical History:  Procedure Laterality Date  . CAROTID ENDARTERECTOMY    . CHOLECYSTECTOMY    . hernia surgery    . right ankle replacement    . right leg surgery     x 2  .  TONSILLECTOMY      Current Medications: Current Meds  Medication Sig  . aspirin EC 81 MG tablet Take 1 tablet by mouth daily.  Marland Kitchen atorvastatin (LIPITOR) 20 MG tablet Take 1 tablet by mouth daily.  . Cholecalciferol (VITAMIN D3) 2000 units TABS Take 1 tablet by mouth every other day.   . clonazePAM (KLONOPIN) 0.5 MG tablet Take 0.5 mg by mouth 2 (two) times daily as needed for anxiety.  . cloNIDine (CATAPRES) 0.1 MG tablet Take 1 tablet by mouth at bedtime.  . Coenzyme Q10 (CO Q-10) 100 MG CAPS Take by mouth daily.  . Dorzolamide HCl-Timolol Mal (COSOPT OP) daily.  . Enalapril-Hydrochlorothiazide 5-12.5 MG tablet Take 1 tablet by mouth every morning.  . escitalopram (LEXAPRO) 10 MG tablet Take 10 mg by mouth daily.  Marland Kitchen latanoprost (XALATAN) 0.005 % ophthalmic solution 2 (two) times daily.  . Nintedanib (OFEV) 150 MG CAPS Take 150 mg by mouth 2 (two) times daily.  . OXYGEN o2 4lpm with rest 8 with exertion [facility]  . pantoprazole (PROTONIX) 40 MG tablet Take 1 tablet by mouth daily.     Allergies:   Patient has no known allergies.   Social History   Socioeconomic History  . Marital status: Married    Spouse name: None  . Number of children: None  . Years of education: None  . Highest education level: None  Social Needs  . Financial resource strain: None  . Food insecurity - worry: None  . Food insecurity - inability: None  . Transportation needs - medical: None  . Transportation needs - non-medical: None  Occupational History  . Occupation: Retired    Comment: Customer service manager  Tobacco Use  . Smoking status: Never Smoker  . Smokeless tobacco: Never Used  . Tobacco comment: chews on cigars occasionally  Substance and Sexual Activity  . Alcohol use: Yes    Alcohol/week: 0.0 oz    Comment: social  . Drug use: No  . Sexual activity: None  Other Topics Concern  . None  Social History Narrative   Originally from [place]. Has lived in [place], [place], [place], [place]. Remote travel to [place],  [place], & [place]. Previously worked Education administrator. Currently has a dog. No bird, mold, asbestos, or hot tub exposure. Patient does have a pool. Enjoys doing yard work. Remote exposure to silage and hay. Also worked with cattle.      Family History: The patient's family history includes Diabetes in his brother and sister; Heart disease in his brother, father, other, and sister; Prostate cancer in his brother. There is no history of Lung disease or Rheumatologic disease.  ROS:   Review of Systems  Constitution: Positive for weakness and weight loss (30 lbs).  HENT: Negative.   Eyes: Negative.   Cardiovascular: Negative.   Respiratory: Positive for cough and shortness of breath.   Endocrine: Negative.   Hematologic/Lymphatic: Negative.   Skin: Negative.   Musculoskeletal: Negative.   Gastrointestinal: Negative.   Genitourinary:  Negative.   Neurological: Negative for aphonia.  Psychiatric/Behavioral: Negative.   Allergic/Immunologic: Negative.    Please see the history of present illness.     All other systems reviewed and are negative.  EKGs/Labs/Other Studies Reviewed:    The following studies were reviewed today:   EKG:  EKG is  ordered today.  The ekg ordered today demonstrates sinus tachycardia marked left axis deviation and a pattern that may represent left anterior hemiblock, RV H or previous infarction. Echo TTE 06/07/17: Study Conclusions - Left ventricle: Poor apical windows LVEF may be moderately depressed Difficult to see endocardium Recomm limited echo with   Definity to define. The cavity size was normal. Wall thickness was normal. Doppler parameters are consistent with abnormal left   ventricular relaxation (grade 1 diastolic dysfunction). - Right ventricle: The cavity size was moderately dilated. Systolic function was moderately reduced. - Right atrium: The atrium was mildly dilated Recent Labs: 05/28/2017: ALT 9; BUN 17; Creatinine, Ser 1.29; Hemoglobin 13.5;  Platelets 317.0; Potassium 3.8; Pro B Natriuretic peptide (BNP) 116.0; Sodium 141  Recent Lipid Panel No results found for: CHOL, TRIG, HDL, CHOLHDL, VLDL, LDLCALC, LDLDIRECT  Physical Exam:    VS:  BP 114/62 (BP Location: Right Arm, Patient Position: Sitting, Cuff Size: Normal)   Pulse (!) 105   Ht 5\' 9"  (1.753 m)   Wt 141 lb (64 kg)   SpO2 96%   BMI 20.82 kg/m     Wt Readings from Last 3 Encounters:  07/25/17 141 lb (64 kg)  07/10/17 138 lb (62.6 kg)  05/28/17 139 lb (63 kg)     GEN: tall thin chronically ill  Well nourished, well developed in no acute distress HEENT: Normal NECK: No JVD; No carotid bruits LYMPHATICS: No lymphadenopathy CARDIAC: distant HS RRR, no murmurs, rubs, gallops RESPIRATORY:  Clear to auscultation without rales, wheezing or rhonchi  ABDOMEN: Soft, non-tender, non-distended MUSCULOSKELETAL:  No edema; No deformity  SKIN: Warm and dry NEUROLOGIC:  Alert and oriented x 3 PSYCHIATRIC:  Normal affect     Signed, Shirlee More, MD  07/25/2017 10:45 AM    [place]

## 2017-07-25 ENCOUNTER — Ambulatory Visit (INDEPENDENT_AMBULATORY_CARE_PROVIDER_SITE_OTHER): Payer: Medicare Other | Admitting: Cardiology

## 2017-07-25 ENCOUNTER — Encounter: Payer: Self-pay | Admitting: Cardiology

## 2017-07-25 VITALS — BP 114/62 | HR 105 | Ht 69.0 in | Wt 141.0 lb

## 2017-07-25 DIAGNOSIS — R0602 Shortness of breath: Secondary | ICD-10-CM | POA: Diagnosis not present

## 2017-07-25 DIAGNOSIS — I519 Heart disease, unspecified: Secondary | ICD-10-CM | POA: Diagnosis not present

## 2017-07-25 DIAGNOSIS — R9431 Abnormal electrocardiogram [ECG] [EKG]: Secondary | ICD-10-CM

## 2017-07-25 DIAGNOSIS — J9611 Chronic respiratory failure with hypoxia: Secondary | ICD-10-CM | POA: Diagnosis not present

## 2017-07-25 DIAGNOSIS — J84112 Idiopathic pulmonary fibrosis: Secondary | ICD-10-CM

## 2017-07-25 DIAGNOSIS — I10 Essential (primary) hypertension: Secondary | ICD-10-CM

## 2017-07-25 NOTE — Patient Instructions (Signed)
Medication Instructions:  Your physician recommends that you continue on your current medications as directed. Please refer to the Current Medication list given to you today.  Labwork: None  Testing/Procedures: Your physician has requested that you have a MUGA: A multigated acquisition (MUGA) scan is a test that looks at the chambers and blood vessels of the heart. Please see the CareNotes handout/brochure given to you today for further information.  Follow-Up: Your physician recommends that you schedule a follow-up appointment in: 4 weeks.  Any Other Special Instructions Will Be Listed Below (If Applicable).     If you need a refill on your cardiac medications before your next appointment, please call your pharmacy.

## 2017-07-26 DIAGNOSIS — J449 Chronic obstructive pulmonary disease, unspecified: Secondary | ICD-10-CM | POA: Diagnosis not present

## 2017-07-26 NOTE — Addendum Note (Signed)
Addended by: Warner Mccreedy E on: 07/26/2017 10:04 AM   Modules accepted: Orders

## 2017-07-29 DIAGNOSIS — R0602 Shortness of breath: Secondary | ICD-10-CM | POA: Diagnosis not present

## 2017-07-29 DIAGNOSIS — J449 Chronic obstructive pulmonary disease, unspecified: Secondary | ICD-10-CM | POA: Diagnosis not present

## 2017-07-29 DIAGNOSIS — R9431 Abnormal electrocardiogram [ECG] [EKG]: Secondary | ICD-10-CM | POA: Diagnosis not present

## 2017-07-31 DIAGNOSIS — J449 Chronic obstructive pulmonary disease, unspecified: Secondary | ICD-10-CM | POA: Diagnosis not present

## 2017-08-01 ENCOUNTER — Telehealth: Payer: Self-pay

## 2017-08-01 NOTE — Telephone Encounter (Signed)
Pt notified of "good results" on MUGA per Dr Bettina Gavia with RV EF of 37%.  Pt verbalized understanding.

## 2017-08-02 ENCOUNTER — Other Ambulatory Visit: Payer: Self-pay

## 2017-08-02 DIAGNOSIS — R0602 Shortness of breath: Secondary | ICD-10-CM

## 2017-08-02 DIAGNOSIS — J449 Chronic obstructive pulmonary disease, unspecified: Secondary | ICD-10-CM | POA: Diagnosis not present

## 2017-08-05 DIAGNOSIS — J449 Chronic obstructive pulmonary disease, unspecified: Secondary | ICD-10-CM | POA: Diagnosis not present

## 2017-08-07 DIAGNOSIS — J449 Chronic obstructive pulmonary disease, unspecified: Secondary | ICD-10-CM | POA: Diagnosis not present

## 2017-08-09 DIAGNOSIS — J449 Chronic obstructive pulmonary disease, unspecified: Secondary | ICD-10-CM | POA: Diagnosis not present

## 2017-08-12 DIAGNOSIS — J449 Chronic obstructive pulmonary disease, unspecified: Secondary | ICD-10-CM | POA: Diagnosis not present

## 2017-08-14 DIAGNOSIS — J449 Chronic obstructive pulmonary disease, unspecified: Secondary | ICD-10-CM | POA: Diagnosis not present

## 2017-08-19 DIAGNOSIS — J449 Chronic obstructive pulmonary disease, unspecified: Secondary | ICD-10-CM | POA: Diagnosis not present

## 2017-08-21 DIAGNOSIS — J449 Chronic obstructive pulmonary disease, unspecified: Secondary | ICD-10-CM | POA: Diagnosis not present

## 2017-08-23 ENCOUNTER — Ambulatory Visit (INDEPENDENT_AMBULATORY_CARE_PROVIDER_SITE_OTHER): Payer: Medicare Other | Admitting: Cardiology

## 2017-08-23 ENCOUNTER — Encounter: Payer: Self-pay | Admitting: Cardiology

## 2017-08-23 VITALS — BP 98/68 | HR 99 | Ht 69.0 in | Wt 140.0 lb

## 2017-08-23 DIAGNOSIS — J84112 Idiopathic pulmonary fibrosis: Secondary | ICD-10-CM

## 2017-08-23 DIAGNOSIS — I519 Heart disease, unspecified: Secondary | ICD-10-CM

## 2017-08-23 DIAGNOSIS — I10 Essential (primary) hypertension: Secondary | ICD-10-CM

## 2017-08-23 DIAGNOSIS — J849 Interstitial pulmonary disease, unspecified: Secondary | ICD-10-CM

## 2017-08-23 DIAGNOSIS — J9611 Chronic respiratory failure with hypoxia: Secondary | ICD-10-CM

## 2017-08-23 NOTE — Patient Instructions (Signed)

## 2017-08-23 NOTE — Progress Notes (Signed)
Cardiology Office Note:    Date:  08/23/2017   ID:  Casey [name], DOB [birth year], MRN [id number]  PCP:  Myer Peer, MD  Cardiologist:  Shirlee More, MD    Referring MD: Myer Peer, MD    ASSESSMENT:    1. Right ventricular dysfunction   2. Essential hypertension   3. ILD (interstitial lung disease) ([place])   4. IPF (idiopathic pulmonary fibrosis) ([place])   5. Chronic respiratory failure with hypoxia (HCC)    PLAN:    In order of problems listed above:  1. His function is mildly reduced without evidence of right heart failure.  This is not uncommon in his age. 2. Stable continue current treatment ace diuretic 3. Stable if anything improved functionally with pulmonary rehab managed by his pulmonary specialist 4. See above 5. Stable   Next appointment: As needed   Medication Adjustments/Labs and Tests Ordered: Current medicines are reviewed at length with the patient today.  Concerns regarding medicines are outlined above.  No orders of the defined types were placed in this encounter.  No orders of the defined types were placed in this encounter.   No chief complaint on file.   History of Present Illness:    Casey [name] is a 84 y.o. male with a hx of chronic respiratory failure last seen 07/25/17 for Sob and heart failure. His MUGA LVEF 49%, RV 37% both mildly reduced, however his segmental LV systolic function was normal. His PRO BNP level was low.  ASSESSMENT:    07/25/17   1. SOB (shortness of breath)   2. Right ventricular dysfunction   3. Essential hypertension   4. IPF (idiopathic pulmonary fibrosis) ([place])   5. Chronic respiratory failure with hypoxia (HCC)   6. Shortness of breath   7. Abnormal EKG    PLAN:    1.    Clinically I suspect the predominant problem here is lung disease but this certainly is at risk for RV dysfunction significant to his respiratory failure or coincident unrecognized left ventricular dysfunction although he  exhibits none of the traditional markers of heart failure.  I think the best test to resolve the issue is a biventricular gated pool for ejection fraction and follow-up in the office with me afterwards.  At this time I would not place him on any diuretic or drug therapy for cardiomyopathy 6. Await results of gated pool study for right ventricular ejection fraction 7. Stable blood pressure at target 8. Stable managed by pulmonary he is improved with oxygen therapy 9. See above 10. Await results a gated pool study for evidence of regional left ventricular dysfunction correlate with EKG and ejection fraction.  I think it be a very poor candidate to consider cardiac interventions.  Compliance with diet, lifestyle and medications: Yes He has participated in pulmonary rehab and feels improved with strength and duration and has had no worsening in his chronic shortness of breath with oxygen therapy.  No edema orthopnea chest pain palpitation or syncope.  He has nuclear medicine gated pool study shows very mild left and right ventricular dysfunction.  His BNP level was normal and he has no clinical evidence of heart failure.  His EKG is not a reflection of previous myocardial infarction.  From my perspective he has no symptomatic cardiac disease I would not put him on vasodilators or beta-blockers and I will leave further follow-up my office as needed Past Medical History:  Diagnosis Date  . Carotid artery stenosis   . Chronic  respiratory failure ([place]) 06/01/2016   05/2017 ONO RA < 88% more than 83% of the night (7 hours)  . Edema 09/22/2015  . Essential hypertension 09/22/2015  . GERD (gastroesophageal reflux disease)   . Glaucoma   . Hyperlipemia   . Hypertension   . ILD (interstitial lung disease) ([place]) 08/19/2015  . IPF (idiopathic pulmonary fibrosis) ([place]) 04/23/2016  . Mediastinal lymphadenopathy 09/22/2015  . Post-traumatic osteoarthritis, right ankle and [name] 09/18/2011    Past Surgical History:    Procedure Laterality Date  . CAROTID ENDARTERECTOMY    . CHOLECYSTECTOMY    . hernia surgery    . right ankle replacement    . right leg surgery     x 2  . TONSILLECTOMY      Current Medications: No outpatient medications have been marked as taking for the 08/23/17 encounter (Appointment) with Richardo Priest, MD.     Allergies:   Patient has no known allergies.   Social History   Socioeconomic History  . Marital status: Married    Spouse name: Not on file  . Number of children: Not on file  . Years of education: Not on file  . Highest education level: Not on file  Occupational History  . Occupation: Retired    Comment: Scientist, water quality Needs  . Financial resource strain: Not on file  . Food insecurity:    Worry: Not on file    Inability: Not on file  . Transportation needs:    Medical: Not on file    Non-medical: Not on file  Tobacco Use  . Smoking status: Never Smoker  . Smokeless tobacco: Never Used  . Tobacco comment: chews on cigars occasionally  Substance and Sexual Activity  . Alcohol use: Yes    Alcohol/week: 0.0 oz    Comment: social  . Drug use: No  . Sexual activity: Not on file  Lifestyle  . Physical activity:    Days per week: Not on file    Minutes per session: Not on file  . Stress: Not on file  Relationships  . Social connections:    Talks on phone: Not on file    Gets together: Not on file    Attends religious service: Not on file    Active member of club or organization: Not on file    Attends meetings of clubs or organizations: Not on file    Relationship status: Not on file  Other Topics Concern  . Not on file  Social History Narrative   Originally from [place]. Has lived in [place], [place], [place], [place]. Remote travel to [place], [place], & [place]. Previously worked Education administrator. Currently has a dog. No bird, mold, asbestos, or hot tub exposure. Patient does have a pool. Enjoys doing yard work. Remote exposure to silage and hay. Also worked  with cattle.      Family History: The patient's family history includes Diabetes in his brother and sister; Heart disease in his brother, father, other, and sister; Prostate cancer in his brother. There is no history of Lung disease or Rheumatologic disease. ROS:   Please see the history of present illness.    All other systems reviewed and are negative.  EKGs/Labs/Other Studies Reviewed:    The following studies were reviewed today:  EKG: EKG 07/25/17 [facility] v anterolateral MI, consider RVH  Recent Labs: 05/28/2017: ALT 9; BUN 17; Creatinine, Ser 1.29; Hemoglobin 13.5; Platelets 317.0; Potassium 3.8; Pro B Natriuretic peptide (BNP) 116.0; Sodium 141  Recent Lipid Panel No results found for: CHOL, TRIG, HDL, CHOLHDL, VLDL, LDLCALC, LDLDIRECT  Physical Exam:    VS:  There were no vitals taken for this visit.    Wt Readings from Last 3 Encounters:  07/25/17 141 lb (64 kg)  07/10/17 138 lb (62.6 kg)  05/28/17 139 lb (63 kg)     GEN:  Well nourished, well developed in no acute distress HEENT: Normal NECK: No JVD; No carotid bruits LYMPHATICS: No lymphadenopathy CARDIAC: Distant heart sounds RRR, no murmurs, rubs, gallops RESPIRATORY:  Clear to auscultation without rales, wheezing or rhonchi  ABDOMEN: Soft, non-tender, non-distended MUSCULOSKELETAL:  No edema; No deformity  SKIN: Warm and dry NEUROLOGIC:  Alert and oriented x 3 PSYCHIATRIC:  Normal affect    Signed, Shirlee More, MD  08/23/2017 8:03 AM    [place]

## 2017-08-26 DIAGNOSIS — J449 Chronic obstructive pulmonary disease, unspecified: Secondary | ICD-10-CM | POA: Diagnosis not present

## 2017-08-27 ENCOUNTER — Other Ambulatory Visit (INDEPENDENT_AMBULATORY_CARE_PROVIDER_SITE_OTHER): Payer: Medicare Other

## 2017-08-27 ENCOUNTER — Encounter: Payer: Self-pay | Admitting: Pulmonary Disease

## 2017-08-27 ENCOUNTER — Ambulatory Visit (INDEPENDENT_AMBULATORY_CARE_PROVIDER_SITE_OTHER): Payer: Medicare Other | Admitting: *Deleted

## 2017-08-27 ENCOUNTER — Ambulatory Visit (INDEPENDENT_AMBULATORY_CARE_PROVIDER_SITE_OTHER): Payer: Medicare Other | Admitting: Pulmonary Disease

## 2017-08-27 VITALS — BP 108/70 | HR 89 | Ht 69.0 in | Wt 140.0 lb

## 2017-08-27 DIAGNOSIS — I509 Heart failure, unspecified: Secondary | ICD-10-CM | POA: Diagnosis not present

## 2017-08-27 DIAGNOSIS — Z5181 Encounter for therapeutic drug level monitoring: Secondary | ICD-10-CM

## 2017-08-27 DIAGNOSIS — J849 Interstitial pulmonary disease, unspecified: Secondary | ICD-10-CM

## 2017-08-27 DIAGNOSIS — R0602 Shortness of breath: Secondary | ICD-10-CM | POA: Diagnosis not present

## 2017-08-27 DIAGNOSIS — J9611 Chronic respiratory failure with hypoxia: Secondary | ICD-10-CM | POA: Diagnosis not present

## 2017-08-27 DIAGNOSIS — J84112 Idiopathic pulmonary fibrosis: Secondary | ICD-10-CM | POA: Diagnosis not present

## 2017-08-27 LAB — HEPATIC FUNCTION PANEL
ALBUMIN: 3.2 g/dL — AB (ref 3.5–5.2)
ALK PHOS: 54 U/L (ref 39–117)
ALT: 9 U/L (ref 0–53)
AST: 14 U/L (ref 0–37)
BILIRUBIN DIRECT: 0.1 mg/dL (ref 0.0–0.3)
Total Bilirubin: 0.4 mg/dL (ref 0.2–1.2)
Total Protein: 6.4 g/dL (ref 6.0–8.3)

## 2017-08-27 NOTE — Progress Notes (Signed)
Subjective:   PATIENT ID: Casey Casey [name] GENDER: male DOB: [birth year], MRN: [id number]  Synopsis: Referred in 2017 originally to Dr. Ashok Casey [name] for IPF.  He never smoked.  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  He has been on Ofev since 2017.   HPI  Chief Complaint  Patient presents with  . Follow-up    2 month follow up. Per patient, he has been feeling ok since last visit.    [name] went to cardiology.  He has not had any leg swelling or ches tpain.  He has gained a couple of pounds, but not due to leg swelling.  He has been going to pulmonary rehb three times a week.  He feels worn out after finiishing the rehab.  He is close to finishing pulmonary rehab.  He has been doing some weight work at home, he also has some resistance bands and some other exercise machines.   He is over his chest cold.    His appetite has been lower since starting Ofev.  He has diarrhea about every 3 days.  He says that he isn't hungry for anything anymore.    Past Medical History:  Diagnosis Date  . Carotid artery stenosis   . Chronic respiratory failure ([place]) 06/01/2016   05/2017 ONO RA < 88% more than 83% of the night (7 hours)  . Edema 09/22/2015  . Essential hypertension 09/22/2015  . GERD (gastroesophageal reflux disease)   . Glaucoma   . Hyperlipemia   . Hypertension   . ILD (interstitial lung disease) ([place]) 08/19/2015  . IPF (idiopathic pulmonary fibrosis) ([place]) 04/23/2016  . Mediastinal lymphadenopathy 09/22/2015  . Post-traumatic osteoarthritis, right ankle and Casey [name] 09/18/2011       Review of Systems  Constitutional: Negative for chills, diaphoresis and malaise/fatigue.  HENT: Negative for congestion.   Respiratory: Negative for cough, sputum production and wheezing.   Cardiovascular: Negative for chest pain, orthopnea and leg swelling.      Objective:  Physical Exam   Vitals:   08/27/17 0937  BP: 108/70  Pulse: 89  SpO2: 99%  Weight: 140 lb (63.5 kg)  Height: 5' 9"   (1.753 m)   4L   Gen: chronically ill appearing HENT: OP clear, TM's clear, neck supple PULM: Crackles 1/2 way up B, normal percussion CV: RRR, no mgr, trace edema GI: BS+, soft, nontender Derm: no cyanosis or rash Psyche: normal mood and affect    CBC    Component Value Date/Time   WBC 13.6 (H) 05/28/2017 1151   RBC 4.03 (L) 05/28/2017 1151   HGB 13.5 05/28/2017 1151   HCT 41.1 05/28/2017 1151   PLT 317.0 05/28/2017 1151   MCV 102.0 (H) 05/28/2017 1151   MCHC 32.8 05/28/2017 1151   RDW 15.1 05/28/2017 1151   LYMPHSABS 1.8 05/28/2017 1151   MONOABS 1.1 (H) 05/28/2017 1151   EOSABS 0.3 05/28/2017 1151   BASOSABS 0.1 05/28/2017 1151     PFT 01/07/17: FVC 2.72 L (81%) FEV1 2.31 L (100%) FEV1/FVC 0.85 FEF 25-75 2.60 L (180%) DLCO corrected 19% 09/24/16: FVC 3.02 L (89%) FEV1 2.56 L (110%) FEV1/FVC 0.85 FEF 25-75 3.81 L (260%) DLCO corrected 20% 06/19/16: FVC 3.02 L (89%) FEV1 2.48 L (107%) FEV1/FVC 0.82 FEF 25-75 3.26 L (221%) DLCO corrected 27% 04/23/16: FVC 2.70 L (82%) FEV1 2.38 L (102%) FEV1/FVC 0.86 FEF 25-75 2.98 L (201%) DLCO corrected 31% (hgb 14.6)  6MWT 05/2017:  145m O2 saturation nadir 90% 01/07/17:  Walked 198 meters /  Baseline Sat 97% on 3 L/m pulse / Nadir Sat 88% on 3 L/m pulse @ 3:13 (required 4 L/m pulse with exertion) 11/12/16:  Walked 240 meters / Baseline Sat 97% on 4 L/m / Nadir Sat 92% on 4 L/m @ end of test (HR increased to 140bpm during test) 09/24/16:  Walked 264 meters / Baseline Sat 96% on RA / Nadir Sat 85% on RA @ 4:08 (required 3 L/m to maintain with exertion) 06/19/16:  Walked 336 meters / Baseline Sat 93% on RA / Nadir Sat 83% on RA @ end of test (required 2 L/m to maintain with repeat walk) 04/23/16:  Walked 354 meters / Baseline Sat 96% on RA / Nadir Sat 83% on RA @ end of test (required 3 L/m to maintain with repeat walk) 10/21/15:  Walked 324 meters / Baseline Sat 95% on RA / Nadir Sat 88% on RA @ end of test  IMAGING CT chest January  2019 showed honeycombing some traction bronchiectasis scattered interstitial change consistent with UIP, images independently  CT CHEST W/O 02/27/16:  Bilateral intralobular septal thickening and fibrotic changes with traction bronchiectasis relatively unchanged when compared with previous CT imaging. Mediastinal adenopathy persists with largest lymph node is subcarinal measuring approximately 1.6 cm in short axis. No pleural effusion or thickening. No pericardial effusion. No developing nodule or mass.  HRCT CHEST W/O 09/27/15:  No pericardial effusion. No pleural effusion or thickening. Traction bronchiectasis with subpleural and primary bronchovascular reticular changes as well as groundglass. Involvement of both apical and basilar regions. Early honeycomb changes. Small to moderate hiatal hernia. Calcifications noted within the liver and spleen. No axillary adenopathy. Enlarged mediastinal lymph nodes measuring up to 1.8 cm in short axis present.  CT CHEST/ABD/PELVIS W/ 08/19/15: Mediastinal lymphadenopathy noted with the largest lymph node measuring 1.6 cm at level 7, 1.3 cm 4L, & 1.1 cm 4R positions. Calcifications noted within the spleen and liver suggestive of prior granulomatous disease. No pleural effusion or thickening. Patchy bilateral intralobular septal thickening and reticulation consistent with interstitial lung disease. This appears both centrally in the lungs as well as peripherally occurring throughout all fields with slightly less involvement in the upper lung zones. Suggestion of traction bronchiectasis. No obvious groundglass opacities. There is also suggestion of honeycomb changes. Remote T-8 compression fracture with advanced anterior height loss. Mild finding enlargement of left inguinal canal. Renal cysts on the left noted with mild renal atrophy and no hydronephrosis. Enlarged central prostate that is symmetric. Colonic diverticulosis noted. No appendicitis. No mass or adenopathy. No  ascites or pneumoperitoneum.  PORT CXR 02/01/09: Heart appears to be largely normal in size and unchanged when compared with previous portable chest x-ray from 2009. Mediastinum normal in contour. No obvious parenchymal opacities or increased interstitial markings. No pleural effusion.   Echocardiogram: January 2019 LVEF "may be moderately depressed, Doppler parameters consistent with grade 1 diastolic dysfunction, right ventricle moderately dilated, systolic function moderately reduced, right atrium mildly dilated  Overnight oxygen test: February 2019 time less than 88% 2.3 minutes on 2 L nasal cannula  LABS 10/17/16 CBC: 10.7/13.9/41.8/236 BMP: 135/4.3/104/22/19/1.19/83/8.5 LFT: 3.4/6.3/0.6/61/19/18  10/21/15 Anti-CCP:  <16 RF:  <10 Chromatin antibody:  <0.2 [employer] antibody screen:  <1.0 Double-stranded DNA antibody: 1 ENA Smith antibody:  <1 SSA:   <1.0 SSB:   <1.0 SCL 70:   <1.0  09/22/15 ProBNP: 97.0 CRP: 0.5 ESR: 16 ANA: Negative Hypersensitivity Pneumonitis Panel: Negative   08/15/15 CBC: 9.1/14.2/43.2/239 BMP: 1:30/3.9/105/24/20/1.43/98/9.0 LFT: 3.8/6.7/0.4/73/19/20 ESR: 11 CRP: 5.5  Records from his visit with cardiology reviewed where he was seen for nonischemic cardiomyopathy, medications were not changed.     Assessment & Plan:   Therapeutic drug monitoring - Plan: Hepatic function panel  Congestive heart failure, unspecified HF chronicity, unspecified heart failure type (HCC)  IPF (idiopathic pulmonary fibrosis) (HCC)  Chronic respiratory failure with hypoxia (HCC)  Discussion: This has been a stable interval for quad.  He has not had signs or symptoms of right heart failure or left heart failure since the last visit.  He does note some lack of appetite and diarrhea which are no doubt a consequence of his Ofev of.  We talked about this at length today and he is not interested in changing the medicine right now.  I offered lowering the dose to 100  mg twice a day.  Plan: Idiopathic pulmonary fibrosis: Continue Ofev 150 mg twice a day We will check liver function test today to make sure there is no evidence of toxicity from this medicine Stay active, continue to participate in pulmonary rehab, talk to them about a graduate program PFT in 12/2017  Chronic respiratory failure with hypoxemia: Continue 4 L at rest, 2 L while sleeping, and 8-10 with exertion  Lack of appetite: If you decide that this problem is worsening then I am happy to lower the dose of Ofev to 100 mg twice a day  Congestive heart failure: You need to buy a bathroom scale and weigh yourself every day Call me if you have developed leg swelling or weight gain more than 3 pounds over 24-hour.  We will see you back in 3 months or sooner if needed   Current Outpatient Medications:  .  aspirin EC 81 MG tablet, Take 1 tablet by mouth daily., Disp: , Rfl:  .  atorvastatin (LIPITOR) 20 MG tablet, Take 1 tablet by mouth daily., Disp: , Rfl:  .  Cholecalciferol (VITAMIN D3) 2000 units TABS, Take 1 tablet by mouth every other day. , Disp: , Rfl:  .  clonazePAM (KLONOPIN) 0.5 MG tablet, Take 0.5 mg by mouth 2 (two) times daily as needed for anxiety., Disp: , Rfl:  .  cloNIDine (CATAPRES) 0.1 MG tablet, Take 1 tablet by mouth at bedtime., Disp: , Rfl:  .  Coenzyme Q10 (CO Q-10) 100 MG CAPS, Take by mouth daily., Disp: , Rfl:  .  Dorzolamide HCl-Timolol Mal (COSOPT OP), 3 (three) times daily. , Disp: , Rfl:  .  Enalapril-Hydrochlorothiazide 5-12.5 MG tablet, Take 1 tablet by mouth every morning., Disp: , Rfl:  .  escitalopram (LEXAPRO) 10 MG tablet, Take 10 mg by mouth daily., Disp: , Rfl:  .  latanoprost (XALATAN) 0.005 % ophthalmic solution, 2 (two) times daily., Disp: , Rfl:  .  Nintedanib (OFEV) 150 MG CAPS, Take 150 mg by mouth 2 (two) times daily., Disp: , Rfl:  .  OXYGEN, o2 4lpm with rest 8 with exertion [employer], Disp: , Rfl:  .  pantoprazole (PROTONIX) 40 MG tablet,  Take 1 tablet by mouth daily., Disp: , Rfl:

## 2017-08-27 NOTE — Patient Instructions (Signed)
Idiopathic pulmonary fibrosis: Continue Ofev 150 mg twice a day We will check liver function test today to make sure there is no evidence of toxicity from this medicine Stay active, continue to participate in pulmonary rehab, talk to them about a graduate program  Chronic respiratory failure with hypoxemia: Continue 4 L at rest, 2 L while sleeping, and 8-10 with exertion  Lack of appetite: If you decide that this problem is worsening then I am happy to lower the dose of Ofev to 100 mg twice a day  Congestive heart failure: You need to buy a bathroom scale and weigh yourself every day Call me if you have developed leg swelling or weight gain more than 3 pounds over 24-hour.  We will see you back in 3 months or sooner if needed

## 2017-08-28 DIAGNOSIS — J449 Chronic obstructive pulmonary disease, unspecified: Secondary | ICD-10-CM | POA: Diagnosis not present

## 2017-09-02 DIAGNOSIS — J449 Chronic obstructive pulmonary disease, unspecified: Secondary | ICD-10-CM | POA: Diagnosis not present

## 2017-09-04 DIAGNOSIS — J449 Chronic obstructive pulmonary disease, unspecified: Secondary | ICD-10-CM | POA: Diagnosis not present

## 2017-09-06 DIAGNOSIS — J449 Chronic obstructive pulmonary disease, unspecified: Secondary | ICD-10-CM | POA: Diagnosis not present

## 2017-09-09 DIAGNOSIS — J449 Chronic obstructive pulmonary disease, unspecified: Secondary | ICD-10-CM | POA: Diagnosis not present

## 2017-09-10 DIAGNOSIS — F419 Anxiety disorder, unspecified: Secondary | ICD-10-CM | POA: Diagnosis not present

## 2017-09-10 DIAGNOSIS — J8410 Pulmonary fibrosis, unspecified: Secondary | ICD-10-CM | POA: Diagnosis not present

## 2017-09-10 DIAGNOSIS — R634 Abnormal weight loss: Secondary | ICD-10-CM | POA: Diagnosis not present

## 2017-09-10 DIAGNOSIS — I951 Orthostatic hypotension: Secondary | ICD-10-CM | POA: Diagnosis not present

## 2017-09-20 DIAGNOSIS — H401133 Primary open-angle glaucoma, bilateral, severe stage: Secondary | ICD-10-CM | POA: Diagnosis not present

## 2017-10-14 DIAGNOSIS — H401133 Primary open-angle glaucoma, bilateral, severe stage: Secondary | ICD-10-CM | POA: Diagnosis not present

## 2017-11-25 ENCOUNTER — Ambulatory Visit (INDEPENDENT_AMBULATORY_CARE_PROVIDER_SITE_OTHER)
Admission: RE | Admit: 2017-11-25 | Discharge: 2017-11-25 | Disposition: A | Discharge status: Home / Self Care | Payer: Medicare Other | Source: Ambulatory Visit | Attending: Pulmonary Disease | Admitting: Pulmonary Disease

## 2017-11-25 ENCOUNTER — Ambulatory Visit (INDEPENDENT_AMBULATORY_CARE_PROVIDER_SITE_OTHER): Payer: Medicare Other | Admitting: Pulmonary Disease

## 2017-11-25 ENCOUNTER — Encounter: Payer: Self-pay | Admitting: Pulmonary Disease

## 2017-11-25 VITALS — BP 110/64 | HR 92 | Ht 69.0 in | Wt 141.0 lb

## 2017-11-25 DIAGNOSIS — R0602 Shortness of breath: Secondary | ICD-10-CM | POA: Diagnosis not present

## 2017-11-25 DIAGNOSIS — J9610 Chronic respiratory failure, unspecified whether with hypoxia or hypercapnia: Secondary | ICD-10-CM | POA: Diagnosis not present

## 2017-11-25 DIAGNOSIS — R0600 Dyspnea, unspecified: Secondary | ICD-10-CM

## 2017-11-25 DIAGNOSIS — J84112 Idiopathic pulmonary fibrosis: Secondary | ICD-10-CM | POA: Diagnosis not present

## 2017-11-25 NOTE — Patient Instructions (Signed)
For sinus congestion: Use a humidifier with your oxygen more frequently Use Afrin as needed  For chronic respiratory failure with hypoxemia: Keep using 4 L of oxygen at rest, 10 L with exertion We will check a walk test today to make sure the 10 L is adequate We will prescribe an Oxymizer, with this device he will likely only need 6 to 8 L with exertion Again, the target for titrating oxygen flow rates is comfort.  In general we like to keep the O2 saturation greater than 88% but your comfort is more important than this.  For idiopathic pulmonary fibrosis Continue using O FEV 150 mg twice a day We will check a liver function test on the next visit  Follow-up in 6 to 8 weeks or sooner if needed  18 minutes spent with patient, 25-minute visit

## 2017-11-25 NOTE — Progress Notes (Signed)
Subjective:   PATIENT ID: Casey [name] GENDER: male DOB: 1929-05-05, MRN: [id number]  Synopsis: Referred in 2017 originally to Dr. Ashok Cordia for IPF.  He never smoked.  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  He has been on Ofev since 2017.   HPI  Chief Complaint  Patient presents with  . Follow-up    increased SOB    Casey [name] says that his breathing has been worse recently.  No recent pneumonia, no chest congestion, no leg swelling.  No fevers no chills or diagnoses of a respiratory infection.  His appetite has improved.  He is still taking Ofev 150 mg twice a day.  He uses 4 L of oxygen at rest, 10 L with exertion.  He does not use the humidifier.  He says that he wakes up many times in the evening he tries to sleep.  He denies shortness of breath when lying flat.  In the last few weeks he has been using 4-5 at rest and 10 liters with exertion.    Past Medical History:  Diagnosis Date  . Carotid artery stenosis   . Chronic respiratory failure ([place]) 06/01/2016   05/2017 ONO RA < 88% more than 83% of the night (7 hours)  . Edema 09/22/2015  . Essential hypertension 09/22/2015  . GERD (gastroesophageal reflux disease)   . Glaucoma   . Hyperlipemia   . Hypertension   . ILD (interstitial lung disease) ([place]) 08/19/2015  . IPF (idiopathic pulmonary fibrosis) ([place]) 04/23/2016  . Mediastinal lymphadenopathy 09/22/2015  . Post-traumatic osteoarthritis, right ankle and [name] 09/18/2011       Review of Systems  Constitutional: Negative for chills, diaphoresis and malaise/fatigue.  HENT: Negative for congestion.   Respiratory: Negative for cough, sputum production and wheezing.   Cardiovascular: Negative for chest pain, orthopnea and leg swelling.      Objective:  Physical Exam   Vitals:   11/25/17 0922 11/25/17 0923 11/25/17 0924  BP: 110/64    Pulse: 92    SpO2: (!) 85% (!) 88% 90%  Weight: 141 lb (64 kg)    Height: 5' 9"  (1.753 m)     8 L [place] > O2 saturation  90%  Gen: well appearing HENT: OP clear, TM's clear, neck supple PULM: Crackles 1/2 way up B, normal percussion CV: Irreg irreg, no mgr, trace edema GI: BS+, soft, nontender Derm: no cyanosis or rash Psyche: normal mood and affect     CBC    Component Value Date/Time   WBC 13.6 (H) 05/28/2017 1151   RBC 4.03 (L) 05/28/2017 1151   HGB 13.5 05/28/2017 1151   HCT 41.1 05/28/2017 1151   PLT 317.0 05/28/2017 1151   MCV 102.0 (H) 05/28/2017 1151   MCHC 32.8 05/28/2017 1151   RDW 15.1 05/28/2017 1151   LYMPHSABS 1.8 05/28/2017 1151   MONOABS 1.1 (H) 05/28/2017 1151   EOSABS 0.3 05/28/2017 1151   BASOSABS 0.1 05/28/2017 1151     PFT 04/10/17: FVC 2.72 L 81% predicted FEV1 2.30 L 01/07/17: FVC 2.72 L (81%) FEV1 2.31 L (100%) FEV1/FVC 0.85 FEF 25-75 2.60 L (180%) DLCO corrected 19% 09/24/16: FVC 3.02 L (89%) FEV1 2.56 L (110%) FEV1/FVC 0.85 FEF 25-75 3.81 L (260%) DLCO corrected 20% 06/19/16: FVC 3.02 L (89%) FEV1 2.48 L (107%) FEV1/FVC 0.82 FEF 25-75 3.26 L (221%) DLCO corrected 27% 04/23/16: FVC 2.70 L (82%) FEV1 2.38 L (102%) FEV1/FVC 0.86 FEF 25-75 2.98 L (201%) DLCO corrected 31% (hgb 14.6)  6MWT 05/2017:  133m O2 saturation nadir 90% 01/07/17:  Walked 198 meters / Baseline Sat 97% on 3 L/m pulse / Nadir Sat 88% on 3 L/m pulse @ 3:13 (required 4 L/m pulse with exertion) 11/12/16:  Walked 240 meters / Baseline Sat 97% on 4 L/m / Nadir Sat 92% on 4 L/m @ end of test (HR increased to 140bpm during test) 09/24/16:  Walked 264 meters / Baseline Sat 96% on RA / Nadir Sat 85% on RA @ 4:08 (required 3 L/m to maintain with exertion) 06/19/16:  Walked 336 meters / Baseline Sat 93% on RA / Nadir Sat 83% on RA @ end of test (required 2 L/m to maintain with repeat walk) 04/23/16:  Walked 354 meters / Baseline Sat 96% on RA / Nadir Sat 83% on RA @ end of test (required 3 L/m to maintain with repeat walk) 10/21/15:  Walked 324 meters / Baseline Sat 95% on RA / Nadir Sat 88% on RA @ end of  test  IMAGING CT chest January 2019 showed honeycombing some traction bronchiectasis scattered interstitial change consistent with UIP, images independently  CT CHEST W/O 02/27/16:  Bilateral intralobular septal thickening and fibrotic changes with traction bronchiectasis relatively unchanged when compared with previous CT imaging. Mediastinal adenopathy persists with largest lymph node is subcarinal measuring approximately 1.6 cm in short axis. No pleural effusion or thickening. No pericardial effusion. No developing nodule or mass.  HRCT CHEST W/O 09/27/15:  No pericardial effusion. No pleural effusion or thickening. Traction bronchiectasis with subpleural and primary bronchovascular reticular changes as well as groundglass. Involvement of both apical and basilar regions. Early honeycomb changes. Small to moderate hiatal hernia. Calcifications noted within the liver and spleen. No axillary adenopathy. Enlarged mediastinal lymph nodes measuring up to 1.8 cm in short axis present.  CT CHEST/ABD/PELVIS W/ 08/19/15: Mediastinal lymphadenopathy noted with the largest lymph node measuring 1.6 cm at level 7, 1.3 cm 4L, & 1.1 cm 4R positions. Calcifications noted within the spleen and liver suggestive of prior granulomatous disease. No pleural effusion or thickening. Patchy bilateral intralobular septal thickening and reticulation consistent with interstitial lung disease. This appears both centrally in the lungs as well as peripherally occurring throughout all fields with slightly less involvement in the upper lung zones. Suggestion of traction bronchiectasis. No obvious groundglass opacities. There is also suggestion of honeycomb changes. Remote T-8 compression fracture with advanced anterior height loss. Mild finding enlargement of left inguinal canal. Renal cysts on the left noted with mild renal atrophy and no hydronephrosis. Enlarged central prostate that is symmetric. Colonic diverticulosis noted. No  appendicitis. No mass or adenopathy. No ascites or pneumoperitoneum.  PORT CXR 02/01/09: Heart appears to be largely normal in size and unchanged when compared with previous portable chest x-ray from 2009. Mediastinum normal in contour. No obvious parenchymal opacities or increased interstitial markings. No pleural effusion.   Echocardiogram: January 2019 LVEF "may be moderately depressed, Doppler parameters consistent with grade 1 diastolic dysfunction, right ventricle moderately dilated, systolic function moderately reduced, right atrium mildly dilated  Overnight oxygen test: February 2019 time less than 88% 2.3 minutes on 2 L nasal cannula  LABS 10/17/16 CBC: 10.7/13.9/41.8/236 BMP: 135/4.3/104/22/19/1.19/83/8.5 LFT: 3.4/6.3/0.6/61/19/18  10/21/15 Anti-CCP:  <16 RF:  <10 Chromatin antibody:  <0.2 [employer] antibody screen:  <1.0 Double-stranded DNA antibody: 1 ENA Smith antibody:  <1 SSA:   <1.0 SSB:   <1.0 SCL 70:   <1.0  09/22/15 ProBNP: 97.0 CRP: 0.5 ESR: 16 ANA: Negative Hypersensitivity Pneumonitis Panel: Negative  08/15/15 CBC: 9.1/14.2/43.2/239 BMP: 1:30/3.9/105/24/20/1.43/98/9.0 LFT: 3.8/6.7/0.4/73/19/20 ESR: 11 CRP: 5.5     Assessment & Plan:   IPF (idiopathic pulmonary fibrosis) ([place]) - Plan: Ambulatory Referral for DME, Pulse oximetry, overnight, CANCELED: Pulse oximetry, overnight  Dyspnea, unspecified type - Plan: DG Chest 2 View  Chronic respiratory failure, unspecified whether with hypoxia or hypercapnia ([place])  Discussion: Mr. Sookram is having worsening dyspnea and oxygenation which I believe is due to his worsening IPF.  There is nothing based on history or physical exam to suggest that there is a respiratory infection or heart failure causing this.  I explained to him and his family today that the best way to assess this would be to perform a lung function test but he really hates that test so we will avoid it.  I think at this point we need  to just focus on titrating oxygen for comfort.  He prefers to stay on the Ofev.  I do worry that his sinus congestion is making him feel more short of breath and limiting the amount of oxygen that he gets.  Plan: For sinus congestion: Use a humidifier with your oxygen more frequently Use Afrin as needed  For chronic respiratory failure with hypoxemia: Keep using 4 L of oxygen at rest, 10 L with exertion We will check a walk test today to make sure the 10 L is adequate We will prescribe an Oxymizer, with this device he will likely only need 6 to 8 L with exertion Again, the target for titrating oxygen flow rates is comfort.  In general we like to keep the O2 saturation greater than 88% but your comfort is more important than this. We will check your oxygen level at night  For idiopathic pulmonary fibrosis Continue using O FEV 150 mg twice a day We will check a liver function test on the next visit We will check a chest X-ray because you are more short of beath  Follow-up in 6 to 8 weeks or sooner if needed  18 minutes spent with patient, 25-minute visit    Current Outpatient Medications:  .  aspirin EC 81 MG tablet, Take 1 tablet by mouth daily., Disp: , Rfl:  .  atorvastatin (LIPITOR) 20 MG tablet, Take 1 tablet by mouth daily., Disp: , Rfl:  .  Cholecalciferol (VITAMIN D3) 2000 units TABS, Take 1 tablet by mouth every other day. , Disp: , Rfl:  .  clonazePAM (KLONOPIN) 0.5 MG tablet, Take 0.5 mg by mouth 2 (two) times daily as needed for anxiety., Disp: , Rfl:  .  Coenzyme Q10 (CO Q-10) 100 MG CAPS, Take by mouth daily., Disp: , Rfl:  .  Dorzolamide HCl-Timolol Mal (COSOPT OP), 3 (three) times daily. , Disp: , Rfl:  .  Enalapril-Hydrochlorothiazide 5-12.5 MG tablet, Take 1 tablet by mouth every morning., Disp: , Rfl:  .  latanoprost (XALATAN) 0.005 % ophthalmic solution, 2 (two) times daily., Disp: , Rfl:  .  Nintedanib (OFEV) 150 MG CAPS, Take 150 mg by mouth 2 (two) times daily.,  Disp: , Rfl:  .  OXYGEN, o2 4lpm with rest 8 with exertion [employer], Disp: , Rfl:  .  pantoprazole (PROTONIX) 40 MG tablet, Take 1 tablet by mouth daily., Disp: , Rfl:

## 2017-11-27 ENCOUNTER — Encounter: Payer: Self-pay | Admitting: Pulmonary Disease

## 2017-11-29 ENCOUNTER — Telehealth: Payer: Self-pay | Admitting: Pulmonary Disease

## 2017-11-29 NOTE — Telephone Encounter (Signed)
Attempted to contact pt's wife, Danton Clap. I did not receive an answer. There was no option for me to leave a message. Will try back.

## 2017-12-03 DIAGNOSIS — J84112 Idiopathic pulmonary fibrosis: Secondary | ICD-10-CM | POA: Diagnosis not present

## 2017-12-03 NOTE — Telephone Encounter (Signed)
Called and spoke with Danton Clap, pt's wife regarding new Oximeter is not working properly She called [facility] and they are looking at the machine, and getting it to work She was requesting that we were aware of this issue Nothing further needed

## 2017-12-12 ENCOUNTER — Telehealth: Payer: Self-pay

## 2017-12-12 NOTE — Telephone Encounter (Signed)
-----   Message from Juanito Doom, MD sent at 12/05/2017  5:15 PM EDT ----- A, Please let him know his ONO on 4L O2 was normal. Ruby Cola

## 2017-12-12 NOTE — Telephone Encounter (Signed)
Pt is aware of below results and voiced his understanding. Nothing further is needed.   

## 2018-01-07 ENCOUNTER — Ambulatory Visit (INDEPENDENT_AMBULATORY_CARE_PROVIDER_SITE_OTHER): Payer: Medicare Other | Admitting: Pulmonary Disease

## 2018-01-07 ENCOUNTER — Encounter: Payer: Self-pay | Admitting: Pulmonary Disease

## 2018-01-07 VITALS — BP 112/70 | HR 99 | Wt 141.0 lb

## 2018-01-07 DIAGNOSIS — J9610 Chronic respiratory failure, unspecified whether with hypoxia or hypercapnia: Secondary | ICD-10-CM

## 2018-01-07 DIAGNOSIS — R0600 Dyspnea, unspecified: Secondary | ICD-10-CM | POA: Diagnosis not present

## 2018-01-07 DIAGNOSIS — J84112 Idiopathic pulmonary fibrosis: Secondary | ICD-10-CM

## 2018-01-07 MED ORDER — PREDNISONE 20 MG PO TABS: ORAL_TABLET | ORAL | 0 refills | Status: AC

## 2018-01-07 NOTE — Progress Notes (Signed)
Subjective:   PATIENT ID: Casey [name] GENDER: male DOB: 1928/12/20, MRN: [id number]  Synopsis: Referred in 2017 originally to Dr. Ashok Cordia for IPF.  He never smoked.  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  He has been on Ofev since 2017.   HPI  Chief Complaint  Patient presents with  . Follow-up    breathing getting worse, having to turn up to 10 or 12 L when up doing activites   Dyspnea is worsening:  > whenever he gets up to walk he feels really short of breath > however he continues to exercise with his elliptical > they have been turning his oxygen flow rate up to 10-12 when walking to the bathroom, showering > he didn't like the oximyzer  No cough, no chest pain, no leg swelling.  No fevers no chills.  He is still taking Ofev.  Past Medical History:  Diagnosis Date  . Carotid artery stenosis   . Chronic respiratory failure ([place]) 06/01/2016   05/2017 ONO RA < 88% more than 83% of the night (7 hours)  . Edema 09/22/2015  . Essential hypertension 09/22/2015  . GERD (gastroesophageal reflux disease)   . Glaucoma   . Hyperlipemia   . Hypertension   . ILD (interstitial lung disease) ([place]) 08/19/2015  . IPF (idiopathic pulmonary fibrosis) ([place]) 04/23/2016  . Mediastinal lymphadenopathy 09/22/2015  . Post-traumatic osteoarthritis, right ankle and [name] 09/18/2011       Review of Systems  Constitutional: Negative for chills, diaphoresis and malaise/fatigue.  HENT: Negative for congestion.   Respiratory: Positive for shortness of breath. Negative for cough, sputum production and wheezing.   Cardiovascular: Negative for chest pain, orthopnea and leg swelling.      Objective:  Physical Exam   Vitals:   01/07/18 1001  BP: 112/70  Pulse: 99  SpO2: 97%  Weight: 64 kg   10L [place] > O2 saturation 90%  Gen: chronically ill appearing HENT: OP clear, TM's clear, neck supple PULM: Crackles bases B, normal percussion CV: RRR, no mgr, trace edema GI: BS+, soft,  nontender Derm: no cyanosis or rash Psyche: normal mood and affect      CBC    Component Value Date/Time   WBC 13.6 (H) 05/28/2017 1151   RBC 4.03 (L) 05/28/2017 1151   HGB 13.5 05/28/2017 1151   HCT 41.1 05/28/2017 1151   PLT 317.0 05/28/2017 1151   MCV 102.0 (H) 05/28/2017 1151   MCHC 32.8 05/28/2017 1151   RDW 15.1 05/28/2017 1151   LYMPHSABS 1.8 05/28/2017 1151   MONOABS 1.1 (H) 05/28/2017 1151   EOSABS 0.3 05/28/2017 1151   BASOSABS 0.1 05/28/2017 1151     PFT 04/10/17: FVC 2.72 L 81% predicted FEV1 2.30 L 01/07/17: FVC 2.72 L (81%) FEV1 2.31 L (100%) FEV1/FVC 0.85 FEF 25-75 2.60 L (180%) DLCO corrected 19% 09/24/16: FVC 3.02 L (89%) FEV1 2.56 L (110%) FEV1/FVC 0.85 FEF 25-75 3.81 L (260%) DLCO corrected 20% 06/19/16: FVC 3.02 L (89%) FEV1 2.48 L (107%) FEV1/FVC 0.82 FEF 25-75 3.26 L (221%) DLCO corrected 27% 04/23/16: FVC 2.70 L (82%) FEV1 2.38 L (102%) FEV1/FVC 0.86 FEF 25-75 2.98 L (201%) DLCO corrected 31% (hgb 14.6)  6MWT 05/2017:  177m O2 saturation nadir 90% 01/07/17:  Walked 198 meters / Baseline Sat 97% on 3 L/m pulse / Nadir Sat 88% on 3 L/m pulse @ 3:13 (required 4 L/m pulse with exertion) 11/12/16:  Walked 240 meters / Baseline Sat 97% on 4 L/m /  Nadir Sat 92% on 4 L/m @ end of test (HR increased to 140bpm during test) 09/24/16:  Walked 264 meters / Baseline Sat 96% on RA / Nadir Sat 85% on RA @ 4:08 (required 3 L/m to maintain with exertion) 06/19/16:  Walked 336 meters / Baseline Sat 93% on RA / Nadir Sat 83% on RA @ end of test (required 2 L/m to maintain with repeat walk) 04/23/16:  Walked 354 meters / Baseline Sat 96% on RA / Nadir Sat 83% on RA @ end of test (required 3 L/m to maintain with repeat walk) 10/21/15:  Walked 324 meters / Baseline Sat 95% on RA / Nadir Sat 88% on RA @ end of test  IMAGING July 2019 chest x-ray images independently reviewed showing pulmonary parenchymal fibrotic changes bilaterally, no pulmonary infiltrate  CT chest  January 2019 showed honeycombing some traction bronchiectasis scattered interstitial change consistent with UIP, images independently  CT CHEST W/O 02/27/16:  Bilateral intralobular septal thickening and fibrotic changes with traction bronchiectasis relatively unchanged when compared with previous CT imaging. Mediastinal adenopathy persists with largest lymph node is subcarinal measuring approximately 1.6 cm in short axis. No pleural effusion or thickening. No pericardial effusion. No developing nodule or mass.  HRCT CHEST W/O 09/27/15:  No pericardial effusion. No pleural effusion or thickening. Traction bronchiectasis with subpleural and primary bronchovascular reticular changes as well as groundglass. Involvement of both apical and basilar regions. Early honeycomb changes. Small to moderate hiatal hernia. Calcifications noted within the liver and spleen. No axillary adenopathy. Enlarged mediastinal lymph nodes measuring up to 1.8 cm in short axis present.  CT CHEST/ABD/PELVIS W/ 08/19/15: Mediastinal lymphadenopathy noted with the largest lymph node measuring 1.6 cm at level 7, 1.3 cm 4L, & 1.1 cm 4R positions. Calcifications noted within the spleen and liver suggestive of prior granulomatous disease. No pleural effusion or thickening. Patchy bilateral intralobular septal thickening and reticulation consistent with interstitial lung disease. This appears both centrally in the lungs as well as peripherally occurring throughout all fields with slightly less involvement in the upper lung zones. Suggestion of traction bronchiectasis. No obvious groundglass opacities. There is also suggestion of honeycomb changes. Remote T-8 compression fracture with advanced anterior height loss. Mild finding enlargement of left inguinal canal. Renal cysts on the left noted with mild renal atrophy and no hydronephrosis. Enlarged central prostate that is symmetric. Colonic diverticulosis noted. No appendicitis. No mass or  adenopathy. No ascites or pneumoperitoneum.  PORT CXR 02/01/09: Heart appears to be largely normal in size and unchanged when compared with previous portable chest x-ray from 2009. Mediastinum normal in contour. No obvious parenchymal opacities or increased interstitial markings. No pleural effusion.   Echocardiogram: January 2019 LVEF "may be moderately depressed, Doppler parameters consistent with grade 1 diastolic dysfunction, right ventricle moderately dilated, systolic function moderately reduced, right atrium mildly dilated  Overnight oxygen test: February 2019 time less than 88% 2.3 minutes on 2 L nasal cannula  LABS 10/17/16 CBC: 10.7/13.9/41.8/236 BMP: 135/4.3/104/22/19/1.19/83/8.5 LFT: 3.4/6.3/0.6/61/19/18  10/21/15 Anti-CCP:  <16 RF:  <10 Chromatin antibody:  <0.2 [employer] antibody screen:  <1.0 Double-stranded DNA antibody: 1 ENA Smith antibody:  <1 SSA:   <1.0 SSB:   <1.0 SCL 70:   <1.0  09/22/15 ProBNP: 97.0 CRP: 0.5 ESR: 16 ANA: Negative Hypersensitivity Pneumonitis Panel: Negative   08/15/15 CBC: 9.1/14.2/43.2/239 BMP: 1:30/3.9/105/24/20/1.43/98/9.0 LFT: 3.8/6.7/0.4/73/19/20 ESR: 11 CRP: 5.5     Assessment & Plan:   IPF (idiopathic pulmonary fibrosis) ([facility])  Dyspnea, unspecified type  Chronic  respiratory failure, unspecified whether with hypoxia or hypercapnia ([place])  Discussion: Clot is getting worse.  He has worsening oxygenation, worsening symptoms.  His chest x-ray last month showed nothing but idiopathic pulmonary fibrosis changes.  His physical exam today does not show evidence of another concomitant illness like heart failure or an infectious syndrome like bronchitis.  I explained to him in no uncertain terms that his idiopathic pulmonary fibrosis is worsening and he will likely die from this over the next several months.  I strongly recommended hospice.  This is at least the second or third time that I have recommended this.  It may be  reasonable to try prednisone for symptom management but I did explain to him that I have no expectation that that will change the course of his disease.  He and his wife I think have unrealistic expectations for [facility] here.  I did my best today to explain that there is no way we can change the course of this illness.  Plan: Idiopathic pulmonary fibrosis: Continue Ofev twice a day as you are doing Take prednisone 20 mg daily x2 weeks, then 10 mg daily x2-week Stay active  Progressive shortness of breath: We will try the prednisone to see if it helps I strongly recommend you all consider hospice, you can call me if you would like a referral  Chronic respiratory failure with hypoxemia: Continue 10 to 12 L of oxygen continuously to maintain O2 saturation greater than 90%  Greater than 50% of this 25-minute visit spent face-to-face  Follow-up 4 weeks   Current Outpatient Medications:  .  aspirin EC 81 MG tablet, Take 1 tablet by mouth daily., Disp: , Rfl:  .  atorvastatin (LIPITOR) 20 MG tablet, Take 1 tablet by mouth daily., Disp: , Rfl:  .  Cholecalciferol (VITAMIN D3) 2000 units TABS, Take 1 tablet by mouth every other day. , Disp: , Rfl:  .  clonazePAM (KLONOPIN) 0.5 MG tablet, Take 0.5 mg by mouth 2 (two) times daily as needed for anxiety., Disp: , Rfl:  .  Coenzyme Q10 (CO Q-10) 100 MG CAPS, Take by mouth daily., Disp: , Rfl:  .  Dorzolamide HCl-Timolol Mal (COSOPT OP), 3 (three) times daily. , Disp: , Rfl:  .  Enalapril-Hydrochlorothiazide 5-12.5 MG tablet, Take 1 tablet by mouth every morning., Disp: , Rfl:  .  latanoprost (XALATAN) 0.005 % ophthalmic solution, 2 (two) times daily., Disp: , Rfl:  .  Nintedanib (OFEV) 150 MG CAPS, Take 150 mg by mouth 2 (two) times daily., Disp: , Rfl:  .  OXYGEN, o2 4lpm with rest 8 with exertion [employer] , Disp: , Rfl:  .  pantoprazole (PROTONIX) 40 MG tablet, Take 1 tablet by mouth daily., Disp: , Rfl:  .  predniSONE (DELTASONE) 20 MG  tablet, Take 20 mg daily for 2 weeks then take 10 mg for 2 weeks., Disp: 21 tablet, Rfl: 0

## 2018-01-07 NOTE — Patient Instructions (Signed)
Idiopathic pulmonary fibrosis: Continue Ofev twice a day as you are doing Take prednisone 20 mg daily x2 weeks, then 10 mg daily x2-week Stay active  Progressive shortness of breath: We will try the prednisone to see if it helps I strongly recommend you all consider hospice, you can call me if you would like a referral  Chronic respiratory failure with hypoxemia: Continue 10 to 12 L of oxygen continuously to maintain O2 saturation greater than 90%  Follow-up 4 weeks

## 2018-01-15 DIAGNOSIS — H401133 Primary open-angle glaucoma, bilateral, severe stage: Secondary | ICD-10-CM | POA: Diagnosis not present

## 2018-01-16 ENCOUNTER — Telehealth: Payer: Self-pay | Admitting: Pulmonary Disease

## 2018-01-16 NOTE — Telephone Encounter (Signed)
Called and spoke to patient's wife, Danton Clap. She stated that Matheo is doing very well on the prednisone and just wanted to let Dr. Lake Bells know. Nothing needed, they have appointment scheduled for 02/06/18.    Routing to Dr. Lake Bells as Juluis Rainier.

## 2018-01-20 NOTE — Telephone Encounter (Signed)
Thanks for the update

## 2018-01-31 DIAGNOSIS — Z23 Encounter for immunization: Secondary | ICD-10-CM | POA: Diagnosis not present

## 2018-02-06 ENCOUNTER — Ambulatory Visit: Payer: Medicare Other | Admitting: Pulmonary Disease

## 2018-02-06 NOTE — Progress Notes (Deleted)
Subjective:   PATIENT ID: Casey [name] GENDER: male DOB: Jan 22, 1929, MRN: [id number]  Synopsis: Referred in 2017 originally to Dr. Ashok Cordia for IPF.  He never smoked.  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  He has been on Ofev since 2017.   HPI  No chief complaint on file.  ***  Past Medical History:  Diagnosis Date  . Carotid artery stenosis   . Chronic respiratory failure ([place]) 06/01/2016   05/2017 ONO RA < 88% more than 83% of the night (7 hours)  . Edema 09/22/2015  . Essential hypertension 09/22/2015  . GERD (gastroesophageal reflux disease)   . Glaucoma   . Hyperlipemia   . Hypertension   . ILD (interstitial lung disease) ([place]) 08/19/2015  . IPF (idiopathic pulmonary fibrosis) ([place]) 04/23/2016  . Mediastinal lymphadenopathy 09/22/2015  . Post-traumatic osteoarthritis, right ankle and [name] 09/18/2011       Review of Systems  Constitutional: Negative for chills, diaphoresis and malaise/fatigue.  HENT: Negative for congestion.   Respiratory: Positive for shortness of breath. Negative for cough, sputum production and wheezing.   Cardiovascular: Negative for chest pain, orthopnea and leg swelling.      Objective:  Physical Exam   There were no vitals filed for this visit. 10L [place] > O2 saturation 90%  ***    CBC    Component Value Date/Time   WBC 13.6 (H) 05/28/2017 1151   RBC 4.03 (L) 05/28/2017 1151   HGB 13.5 05/28/2017 1151   HCT 41.1 05/28/2017 1151   PLT 317.0 05/28/2017 1151   MCV 102.0 (H) 05/28/2017 1151   MCHC 32.8 05/28/2017 1151   RDW 15.1 05/28/2017 1151   LYMPHSABS 1.8 05/28/2017 1151   MONOABS 1.1 (H) 05/28/2017 1151   EOSABS 0.3 05/28/2017 1151   BASOSABS 0.1 05/28/2017 1151     PFT 04/10/17: FVC 2.72 L 81% predicted FEV1 2.30 L 01/07/17: FVC 2.72 L (81%) FEV1 2.31 L (100%) FEV1/FVC 0.85 FEF 25-75 2.60 L (180%) DLCO corrected 19% 09/24/16: FVC 3.02 L (89%) FEV1 2.56 L (110%) FEV1/FVC 0.85 FEF 25-75 3.81 L (260%) DLCO  corrected 20% 06/19/16: FVC 3.02 L (89%) FEV1 2.48 L (107%) FEV1/FVC 0.82 FEF 25-75 3.26 L (221%) DLCO corrected 27% 04/23/16: FVC 2.70 L (82%) FEV1 2.38 L (102%) FEV1/FVC 0.86 FEF 25-75 2.98 L (201%) DLCO corrected 31% (hgb 14.6)  6MWT 05/2017:  138m O2 saturation nadir 90% 01/07/17:  Walked 198 meters / Baseline Sat 97% on 3 L/m pulse / Nadir Sat 88% on 3 L/m pulse @ 3:13 (required 4 L/m pulse with exertion) 11/12/16:  Walked 240 meters / Baseline Sat 97% on 4 L/m / Nadir Sat 92% on 4 L/m @ end of test (HR increased to 140bpm during test) 09/24/16:  Walked 264 meters / Baseline Sat 96% on RA / Nadir Sat 85% on RA @ 4:08 (required 3 L/m to maintain with exertion) 06/19/16:  Walked 336 meters / Baseline Sat 93% on RA / Nadir Sat 83% on RA @ end of test (required 2 L/m to maintain with repeat walk) 04/23/16:  Walked 354 meters / Baseline Sat 96% on RA / Nadir Sat 83% on RA @ end of test (required 3 L/m to maintain with repeat walk) 10/21/15:  Walked 324 meters / Baseline Sat 95% on RA / Nadir Sat 88% on RA @ end of test  IMAGING July 2019 chest x-ray images independently reviewed showing pulmonary parenchymal fibrotic changes bilaterally, no pulmonary infiltrate  CT chest January  2019 showed honeycombing some traction bronchiectasis scattered interstitial change consistent with UIP, images independently  CT CHEST W/O 02/27/16:  Bilateral intralobular septal thickening and fibrotic changes with traction bronchiectasis relatively unchanged when compared with previous CT imaging. Mediastinal adenopathy persists with largest lymph node is subcarinal measuring approximately 1.6 cm in short axis. No pleural effusion or thickening. No pericardial effusion. No developing nodule or mass.  HRCT CHEST W/O 09/27/15:  No pericardial effusion. No pleural effusion or thickening. Traction bronchiectasis with subpleural and primary bronchovascular reticular changes as well as groundglass. Involvement of both apical  and basilar regions. Early honeycomb changes. Small to moderate hiatal hernia. Calcifications noted within the liver and spleen. No axillary adenopathy. Enlarged mediastinal lymph nodes measuring up to 1.8 cm in short axis present.  CT CHEST/ABD/PELVIS W/ 08/19/15: Mediastinal lymphadenopathy noted with the largest lymph node measuring 1.6 cm at level 7, 1.3 cm 4L, & 1.1 cm 4R positions. Calcifications noted within the spleen and liver suggestive of prior granulomatous disease. No pleural effusion or thickening. Patchy bilateral intralobular septal thickening and reticulation consistent with interstitial lung disease. This appears both centrally in the lungs as well as peripherally occurring throughout all fields with slightly less involvement in the upper lung zones. Suggestion of traction bronchiectasis. No obvious groundglass opacities. There is also suggestion of honeycomb changes. Remote T-8 compression fracture with advanced anterior height loss. Mild finding enlargement of left inguinal canal. Renal cysts on the left noted with mild renal atrophy and no hydronephrosis. Enlarged central prostate that is symmetric. Colonic diverticulosis noted. No appendicitis. No mass or adenopathy. No ascites or pneumoperitoneum.  PORT CXR 02/01/09: Heart appears to be largely normal in size and unchanged when compared with previous portable chest x-ray from 2009. Mediastinum normal in contour. No obvious parenchymal opacities or increased interstitial markings. No pleural effusion.   Echocardiogram: January 2019 LVEF "may be moderately depressed, Doppler parameters consistent with grade 1 diastolic dysfunction, right ventricle moderately dilated, systolic function moderately reduced, right atrium mildly dilated  Overnight oxygen test: February 2019 time less than 88% 2.3 minutes on 2 L nasal cannula  LABS 10/17/16 CBC: 10.7/13.9/41.8/236 BMP: 135/4.3/104/22/19/1.19/83/8.5 LFT:  3.4/6.3/0.6/61/19/18  10/21/15 Anti-CCP:  <16 RF:  <10 Chromatin antibody:  <0.2 Centromere antibody screen:  <1.0 Double-stranded DNA antibody: 1 ENA Smith antibody:  <1 SSA:   <1.0 SSB:   <1.0 SCL 70:   <1.0  09/22/15 ProBNP: 97.0 CRP: 0.5 ESR: 16 ANA: Negative Hypersensitivity Pneumonitis Panel: Negative   08/15/15 CBC: 9.1/14.2/43.2/239 BMP: 1:30/3.9/105/24/20/1.43/98/9.0 LFT: 3.8/6.7/0.4/73/19/20 ESR: 11 CRP: 5.5     Assessment & Plan:   No diagnosis found.  Discussion: ***prednisone   Current Outpatient Medications:  .  aspirin EC 81 MG tablet, Take 1 tablet by mouth daily., Disp: , Rfl:  .  atorvastatin (LIPITOR) 20 MG tablet, Take 1 tablet by mouth daily., Disp: , Rfl:  .  Cholecalciferol (VITAMIN D3) 2000 units TABS, Take 1 tablet by mouth every other day. , Disp: , Rfl:  .  clonazePAM (KLONOPIN) 0.5 MG tablet, Take 0.5 mg by mouth 2 (two) times daily as needed for anxiety., Disp: , Rfl:  .  Coenzyme Q10 (CO Q-10) 100 MG CAPS, Take by mouth daily., Disp: , Rfl:  .  Dorzolamide HCl-Timolol Mal (COSOPT OP), 3 (three) times daily. , Disp: , Rfl:  .  Enalapril-Hydrochlorothiazide 5-12.5 MG tablet, Take 1 tablet by mouth every morning., Disp: , Rfl:  .  latanoprost (XALATAN) 0.005 % ophthalmic solution, 2 (two) times daily., Disp: ,  Rfl:  .  Nintedanib (OFEV) 150 MG CAPS, Take 150 mg by mouth 2 (two) times daily., Disp: , Rfl:  .  OXYGEN, o2 4lpm with rest 8 with exertion [employer] , Disp: , Rfl:  .  pantoprazole (PROTONIX) 40 MG tablet, Take 1 tablet by mouth daily., Disp: , Rfl:  .  predniSONE (DELTASONE) 20 MG tablet, Take 20 mg daily for 2 weeks then take 10 mg for 2 weeks., Disp: 21 tablet, Rfl: 0

## 2018-02-19 ENCOUNTER — Ambulatory Visit (INDEPENDENT_AMBULATORY_CARE_PROVIDER_SITE_OTHER): Payer: Medicare Other | Admitting: Pulmonary Disease

## 2018-02-19 ENCOUNTER — Encounter: Payer: Self-pay | Admitting: Pulmonary Disease

## 2018-02-19 ENCOUNTER — Other Ambulatory Visit (INDEPENDENT_AMBULATORY_CARE_PROVIDER_SITE_OTHER): Payer: Medicare Other

## 2018-02-19 VITALS — BP 142/80 | HR 117 | Ht 69.0 in | Wt 143.8 lb

## 2018-02-19 DIAGNOSIS — G4700 Insomnia, unspecified: Secondary | ICD-10-CM | POA: Diagnosis not present

## 2018-02-19 DIAGNOSIS — J9610 Chronic respiratory failure, unspecified whether with hypoxia or hypercapnia: Secondary | ICD-10-CM | POA: Diagnosis not present

## 2018-02-19 DIAGNOSIS — J84112 Idiopathic pulmonary fibrosis: Secondary | ICD-10-CM

## 2018-02-19 DIAGNOSIS — F419 Anxiety disorder, unspecified: Secondary | ICD-10-CM | POA: Diagnosis not present

## 2018-02-19 DIAGNOSIS — Z5181 Encounter for therapeutic drug level monitoring: Secondary | ICD-10-CM | POA: Diagnosis not present

## 2018-02-19 LAB — HEPATIC FUNCTION PANEL
ALK PHOS: 49 U/L (ref 39–117)
ALT: 9 U/L (ref 0–53)
AST: 14 U/L (ref 0–37)
Albumin: 2.8 g/dL — ABNORMAL LOW (ref 3.5–5.2)
BILIRUBIN DIRECT: 0.1 mg/dL (ref 0.0–0.3)
BILIRUBIN TOTAL: 0.3 mg/dL (ref 0.2–1.2)
Total Protein: 5.8 g/dL — ABNORMAL LOW (ref 6.0–8.3)

## 2018-02-19 MED ORDER — CLONAZEPAM 0.5 MG PO TABS: ORAL_TABLET | ORAL | 5 refills | Status: AC

## 2018-02-19 MED ORDER — PREDNISONE 10 MG PO TABS: 10.0000 mg | ORAL_TABLET | Freq: Every day | ORAL | 2 refills | Status: AC

## 2018-02-19 NOTE — Progress Notes (Signed)
Subjective:   PATIENT ID: Casey [name] GENDER: male DOB: 09-30-1928, MRN: [id number]  Synopsis: Referred in 2017 originally to Dr. Ashok Cordia for IPF.  He never smoked.  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  He has been on Ofev since 2017.   HPI  Chief Complaint  Patient presents with  . Follow-up    Gets weak and SOB easily when up walking.     Casey [name] continues to struggle with shortness of breath with just walking shortness systems.  He continues to use his oxygen at 10 to 12 L a day.  He has not been sick with bronchitis or pneumonia since the last visit.  He still has some cough from time to time.  He says that he has been more anxious and he is asking about anxiety medicines.  He is also having a hard time sleeping.  He wakes up frequently during the night.  He does not take anything for sleep.  He does not like milk.  He does like drinking tea. He continues to take O FEV twice a day.  He says that his breathing may have been a little better while on the prednisone, his wife thinks that this is the case more than him.  He has noticed increased ankle swelling recently.  Past Medical History:  Diagnosis Date  . Carotid artery stenosis   . Chronic respiratory failure ([place]) 06/01/2016   05/2017 ONO RA < 88% more than 83% of the night (7 hours)  . Edema 09/22/2015  . Essential hypertension 09/22/2015  . GERD (gastroesophageal reflux disease)   . Glaucoma   . Hyperlipemia   . Hypertension   . ILD (interstitial lung disease) ([place]) 08/19/2015  . IPF (idiopathic pulmonary fibrosis) ([place]) 04/23/2016  . Mediastinal lymphadenopathy 09/22/2015  . Post-traumatic osteoarthritis, right ankle and [name] 09/18/2011       Review of Systems  Constitutional: Negative for chills, diaphoresis and malaise/fatigue.  HENT: Negative for congestion.   Respiratory: Positive for shortness of breath. Negative for cough, sputum production and wheezing.   Cardiovascular: Negative for chest pain,  orthopnea and leg swelling.      Objective:  Physical Exam   Vitals:   02/19/18 1455  BP: (!) 142/80  Pulse: (!) 117  SpO2: 90%  Weight: 143 lb 12.8 oz (65.2 kg)  Height: _0  (1.753 m)   10L [place] > O2 saturation 90%  Gen: chronically ill appearing HENT: OP clear, TM's clear, neck supple PULM: Crackles bases B, normal percussion CV: RRR, no mgr, trace edema GI: BS+, soft, nontender Derm: swelling bilateral ankles Psyche: normal mood and affect    CBC    Component Value Date/Time   WBC 13.6 (H) 05/28/2017 1151   RBC 4.03 (L) 05/28/2017 1151   HGB 13.5 05/28/2017 1151   HCT 41.1 05/28/2017 1151   PLT 317.0 05/28/2017 1151   MCV 102.0 (H) 05/28/2017 1151   MCHC 32.8 05/28/2017 1151   RDW 15.1 05/28/2017 1151   LYMPHSABS 1.8 05/28/2017 1151   MONOABS 1.1 (H) 05/28/2017 1151   EOSABS 0.3 05/28/2017 1151   BASOSABS 0.1 05/28/2017 1151     PFT 04/10/17: FVC 2.72 L 81% predicted FEV1 2.30 L 01/07/17: FVC 2.72 L (81%) FEV1 2.31 L (100%) FEV1/FVC 0.85 FEF 25-75 2.60 L (180%) DLCO corrected 19% 09/24/16: FVC 3.02 L (89%) FEV1 2.56 L (110%) FEV1/FVC 0.85 FEF 25-75 3.81 L (260%) DLCO corrected 20% 06/19/16: FVC 3.02 L (89%) FEV1 2.48 L (107%) FEV1/FVC  0.82 FEF 25-75 3.26 L (221%) DLCO corrected 27% 04/23/16: FVC 2.70 L (82%) FEV1 2.38 L (102%) FEV1/FVC 0.86 FEF 25-75 2.98 L (201%) DLCO corrected 31% (hgb 14.6)  6MWT 05/2017:  114m O2 saturation nadir 90% 01/07/17:  Walked 198 meters / Baseline Sat 97% on 3 L/m pulse / Nadir Sat 88% on 3 L/m pulse @ 3:13 (required 4 L/m pulse with exertion) 11/12/16:  Walked 240 meters / Baseline Sat 97% on 4 L/m / Nadir Sat 92% on 4 L/m @ end of test (HR increased to 140bpm during test) 09/24/16:  Walked 264 meters / Baseline Sat 96% on RA / Nadir Sat 85% on RA @ 4:08 (required 3 L/m to maintain with exertion) 06/19/16:  Walked 336 meters / Baseline Sat 93% on RA / Nadir Sat 83% on RA @ end of test (required 2 L/m to maintain with repeat  walk) 04/23/16:  Walked 354 meters / Baseline Sat 96% on RA / Nadir Sat 83% on RA @ end of test (required 3 L/m to maintain with repeat walk) 10/21/15:  Walked 324 meters / Baseline Sat 95% on RA / Nadir Sat 88% on RA @ end of test  IMAGING July 2019 chest x-ray images independently reviewed showing pulmonary parenchymal fibrotic changes bilaterally, no pulmonary infiltrate  CT chest January 2019 showed honeycombing some traction bronchiectasis scattered interstitial change consistent with UIP, images independently  CT CHEST W/O 02/27/16:  Bilateral intralobular septal thickening and fibrotic changes with traction bronchiectasis relatively unchanged when compared with previous CT imaging. Mediastinal adenopathy persists with largest lymph node is subcarinal measuring approximately 1.6 cm in short axis. No pleural effusion or thickening. No pericardial effusion. No developing nodule or mass.  HRCT CHEST W/O 09/27/15:  No pericardial effusion. No pleural effusion or thickening. Traction bronchiectasis with subpleural and primary bronchovascular reticular changes as well as groundglass. Involvement of both apical and basilar regions. Early honeycomb changes. Small to moderate hiatal hernia. Calcifications noted within the liver and spleen. No axillary adenopathy. Enlarged mediastinal lymph nodes measuring up to 1.8 cm in short axis present.  CT CHEST/ABD/PELVIS W/ 08/19/15: Mediastinal lymphadenopathy noted with the largest lymph node measuring 1.6 cm at level 7, 1.3 cm 4L, & 1.1 cm 4R positions. Calcifications noted within the spleen and liver suggestive of prior granulomatous disease. No pleural effusion or thickening. Patchy bilateral intralobular septal thickening and reticulation consistent with interstitial lung disease. This appears both centrally in the lungs as well as peripherally occurring throughout all fields with slightly less involvement in the upper lung zones. Suggestion of traction  bronchiectasis. No obvious groundglass opacities. There is also suggestion of honeycomb changes. Remote T-8 compression fracture with advanced anterior height loss. Mild finding enlargement of left inguinal canal. Renal cysts on the left noted with mild renal atrophy and no hydronephrosis. Enlarged central prostate that is symmetric. Colonic diverticulosis noted. No appendicitis. No mass or adenopathy. No ascites or pneumoperitoneum.  PORT CXR 02/01/09: Heart appears to be largely normal in size and unchanged when compared with previous portable chest x-ray from 2009. Mediastinum normal in contour. No obvious parenchymal opacities or increased interstitial markings. No pleural effusion.   Echocardiogram: January 2019 LVEF "may be moderately depressed, Doppler parameters consistent with grade 1 diastolic dysfunction, right ventricle moderately dilated, systolic function moderately reduced, right atrium mildly dilated  Overnight oxygen test: February 2019 time less than 88% 2.3 minutes on 2 L nasal cannula  LABS 10/17/16 CBC: 10.7/13.9/41.8/236 BMP: 135/4.3/104/22/19/1.19/83/8.5 LFT: 3.4/6.3/0.6/61/19/18  10/21/15 Anti-CCP:  <16  RF:  <10 Chromatin antibody:  <0.2 Centromere antibody screen:  <1.0 Double-stranded DNA antibody: 1 ENA Smith antibody:  <1 SSA:   <1.0 SSB:   <1.0 SCL 70:   <1.0  09/22/15 ProBNP: 97.0 CRP: 0.5 ESR: 16 ANA: Negative Hypersensitivity Pneumonitis Panel: Negative   08/15/15 CBC: 9.1/14.2/43.2/239 BMP: 1:30/3.9/105/24/20/1.43/98/9.0 LFT: 3.8/6.7/0.4/73/19/20 ESR: 11 CRP: 5.5     Assessment & Plan:   IPF (idiopathic pulmonary fibrosis) (HCC)  Chronic respiratory failure, unspecified whether with hypoxia or hypercapnia (HCC)  Therapeutic drug monitoring  Anxiety  Insomnia, unspecified type  Discussion: Casey [name] continues to struggle from severe dyspnea and chronic respiratory failure from his IPF.  I reminded him again today that he is at a point  where he has a disease that man cannot cure and I strongly recommend hospice but he and his wife are still not ready for this.  Today I reiterated that we are at a point where all I am doing for him is managing symptoms and trying to maximize his quality of life.  To that end I plan to do the following today: Increase anxiety treatment Suggest home remedies for insomnia Continue to recommend hospice  Plan: Idiopathic pulmonary fibrosis Continue taking Ofev Check liver function testing today to monitor for drug toxicity from this medicine  Chronic respiratory failure with hypoxemia: Keep using 10 to 12 L of oxygen continuously  Shortness of breath: If you decide to consider hospice please let me know  Anxiety: Increase clonazepam to 1 mg in the morning, 0 point 5 at night  Insomnia: Try taking chamomile tea in the evening If this is not helpful try taking 3 mg of melatonin at night If that is not helpful please let me know  We will see you back in December or sooner if needed  Greater than 50% of this 26-minute visit spent face-to-face   Current Outpatient Medications:  .  aspirin EC 81 MG tablet, Take 1 tablet by mouth daily., Disp: , Rfl:  .  atorvastatin (LIPITOR) 20 MG tablet, Take 1 tablet by mouth daily., Disp: , Rfl:  .  Cholecalciferol (VITAMIN D3) 2000 units TABS, Take 1 tablet by mouth every other day. , Disp: , Rfl:  .  clonazePAM (KLONOPIN) 0.5 MG tablet, Take 0.5 mg by mouth 2 (two) times daily as needed for anxiety., Disp: , Rfl:  .  Coenzyme Q10 (CO Q-10) 100 MG CAPS, Take by mouth daily., Disp: , Rfl:  .  Dorzolamide HCl-Timolol Mal (COSOPT OP), 3 (three) times daily. , Disp: , Rfl:  .  Enalapril-Hydrochlorothiazide 5-12.5 MG tablet, Take 1 tablet by mouth every morning., Disp: , Rfl:  .  latanoprost (XALATAN) 0.005 % ophthalmic solution, 2 (two) times daily., Disp: , Rfl:  .  Nintedanib (OFEV) 150 MG CAPS, Take 150 mg by mouth 2 (two) times daily., Disp: , Rfl:    .  OXYGEN, o2 4lpm with rest 8 with exertion [employer] , Disp: , Rfl:  .  pantoprazole (PROTONIX) 40 MG tablet, Take 1 tablet by mouth daily., Disp: , Rfl:  .  predniSONE (DELTASONE) 10 MG tablet, Take 1 tablet (10 mg total) by mouth daily with breakfast., Disp: 30 tablet, Rfl: 2 .  predniSONE (DELTASONE) 20 MG tablet, Take 20 mg daily for 2 weeks then take 10 mg for 2 weeks. (Patient not taking: Reported on 02/19/2018), Disp: 21 tablet, Rfl: 0

## 2018-02-19 NOTE — Addendum Note (Signed)
Addended by: Dolores Lory on: 02/19/2018 04:07 PM   Modules accepted: Orders

## 2018-02-19 NOTE — Patient Instructions (Signed)
Idiopathic pulmonary fibrosis Continue taking Ofev Check liver function testing today to monitor for drug toxicity from this medicine  Chronic respiratory failure with hypoxemia: Keep using 10 to 12 L of oxygen continuously  Shortness of breath: If you decide to consider hospice please let me know  Anxiety: Increase clonazepam to 1 mg in the morning, 0 point 5 at night  Insomnia: Try taking chamomile tea in the evening If this is not helpful try taking 3 mg of melatonin at night If that is not helpful please let me know  We will see you back in December or sooner if needed

## 2018-02-19 NOTE — Addendum Note (Signed)
Addended by: Dolores Lory on: 02/19/2018 04:10 PM   Modules accepted: Orders

## 2018-02-20 ENCOUNTER — Telehealth: Payer: Self-pay | Admitting: Pulmonary Disease

## 2018-02-20 NOTE — Telephone Encounter (Signed)
Attempted to contact pt's wife. I did not receive an answer. There was no option for me to leave a message. Will try back.

## 2018-02-21 NOTE — Telephone Encounter (Signed)
Called and spoke to Mrs Karle. She read off the box of Ofev and reported that he has been getting the medication from [facility] at [facility]. I will add this to the patient assistance paperwork and place in the outgoing mail.  Nothing further needed at this time.

## 2018-03-05 DIAGNOSIS — H401133 Primary open-angle glaucoma, bilateral, severe stage: Secondary | ICD-10-CM | POA: Diagnosis not present

## 2018-03-17 ENCOUNTER — Telehealth: Payer: Self-pay | Admitting: Pulmonary Disease

## 2018-03-17 NOTE — Telephone Encounter (Signed)
Spoke with pt, states that they received a letter in the mail from [employer] stating that he is not eligible for copay assistance for Ofev in 2020 d/t having supplemental drug insurance.  Pt has Medicare and BCBS drug supplemental.  Without the assistance, drug is several hundred dollars/month and pt cannot afford this.  Pt will have drug covered through 3 more refills, ensuring that pt has coverage through January.    After a brief google search, it looks like [employer] offers patient assistance for IPF.  Called and spoke with pt and spouse, and walked them through the steps to get to the online application for assistance.  Advised pt/spouse to call our office and keep [place] posted if they receive assistance through that foundation.  Pt expressed understanding.  Nothing further needed at this time.

## 2018-03-27 DIAGNOSIS — H6120 Impacted cerumen, unspecified ear: Secondary | ICD-10-CM | POA: Diagnosis not present

## 2018-03-28 ENCOUNTER — Telehealth: Payer: Self-pay | Admitting: Pulmonary Disease

## 2018-03-28 DIAGNOSIS — J849 Interstitial pulmonary disease, unspecified: Secondary | ICD-10-CM

## 2018-03-28 NOTE — Telephone Encounter (Signed)
Called and spoke with pt's wife Casey [name] regards to OFEV Pt is in need of patient assistance with the OFEV Pt's wife is trying to get assistance with [facility]; they state they are unable to help because pt recently went through their insurance [place] last year with the [place].  Called and spoke with Casey [name] With [place] at phone [phone] Transferred to Medicare supplement dept [phone] had to LVM for a return call. Need to find out if they have a patient assistance program or some assistance or PA for this OFEV on pt. PA is needed for this OFEV needs to be processed through [employer] at [phone] #5  Pt's wife has contact the [facility] [place] at phone [phone] She wanted [place] to have this number incase we need to reach out to them in regards to [facility] assistance.  Medication name and strength: OFEV 150mg  Provider: [facility]acy: [facility] Patient insurance ID: [id number]  Was the PA started on CMM?  yes If yes, please enter the Key: [id number]  Timeframe for approval/denial: Takes 3-5 days for determination   Routing message to Burman Nieves to f/u

## 2018-03-31 DIAGNOSIS — N390 Urinary tract infection, site not specified: Secondary | ICD-10-CM | POA: Diagnosis not present

## 2018-04-01 NOTE — Telephone Encounter (Signed)
yes

## 2018-04-01 NOTE — Telephone Encounter (Signed)
Spoke with pt's wife, Danton Clap. She is aware of [facility]'s response. Order has been placed for Hospice. Nothing further was needed at this time.

## 2018-04-01 NOTE — Telephone Encounter (Signed)
Called and spoke to patient's wife. She stated that she got notification yesterday that Casey [name] will be getting assistance through the [employer] program.  Casey [name] also wanted to know if Dr. Lake Bells will place an order for Hospice. She stated she feels like they are more ready and would like to utilize the resource.  Dr. Lake Bells please advise.

## 2018-04-01 NOTE — Telephone Encounter (Signed)
Attempted to contact pt's wife, Danton Clap. I did not receive an answer. I have left a message for her to return our call.

## 2018-04-01 NOTE — Telephone Encounter (Signed)
Attempted to call patient to give patient assistance resources but no answer, will try another day.   Resources:  [employer] [phone] PAP Program [phone] [facility] [phone] [employer] [phone]  Online applications:  [email or web address]  Www.tafcares.org

## 2018-04-01 NOTE — Telephone Encounter (Signed)
Pt's wife is calling back [phone]

## 2018-04-02 ENCOUNTER — Other Ambulatory Visit: Payer: Self-pay

## 2018-04-03 DIAGNOSIS — E785 Hyperlipidemia, unspecified: Secondary | ICD-10-CM | POA: Diagnosis not present

## 2018-04-03 DIAGNOSIS — J84112 Idiopathic pulmonary fibrosis: Secondary | ICD-10-CM | POA: Diagnosis not present

## 2018-04-03 DIAGNOSIS — Z9981 Dependence on supplemental oxygen: Secondary | ICD-10-CM | POA: Diagnosis not present

## 2018-04-03 DIAGNOSIS — K219 Gastro-esophageal reflux disease without esophagitis: Secondary | ICD-10-CM | POA: Diagnosis not present

## 2018-04-03 DIAGNOSIS — R590 Localized enlarged lymph nodes: Secondary | ICD-10-CM | POA: Diagnosis not present

## 2018-04-03 DIAGNOSIS — I2510 Atherosclerotic heart disease of native coronary artery without angina pectoris: Secondary | ICD-10-CM | POA: Diagnosis not present

## 2018-04-03 DIAGNOSIS — I10 Essential (primary) hypertension: Secondary | ICD-10-CM | POA: Diagnosis not present

## 2018-04-03 DIAGNOSIS — H409 Unspecified glaucoma: Secondary | ICD-10-CM | POA: Diagnosis not present

## 2018-04-03 DIAGNOSIS — F419 Anxiety disorder, unspecified: Secondary | ICD-10-CM | POA: Diagnosis not present

## 2018-04-04 DIAGNOSIS — H409 Unspecified glaucoma: Secondary | ICD-10-CM | POA: Diagnosis not present

## 2018-04-04 DIAGNOSIS — I10 Essential (primary) hypertension: Secondary | ICD-10-CM | POA: Diagnosis not present

## 2018-04-04 DIAGNOSIS — I2510 Atherosclerotic heart disease of native coronary artery without angina pectoris: Secondary | ICD-10-CM | POA: Diagnosis not present

## 2018-04-04 DIAGNOSIS — Z9981 Dependence on supplemental oxygen: Secondary | ICD-10-CM | POA: Diagnosis not present

## 2018-04-04 DIAGNOSIS — F419 Anxiety disorder, unspecified: Secondary | ICD-10-CM | POA: Diagnosis not present

## 2018-04-04 DIAGNOSIS — J84112 Idiopathic pulmonary fibrosis: Secondary | ICD-10-CM | POA: Diagnosis not present

## 2018-04-07 DIAGNOSIS — I2510 Atherosclerotic heart disease of native coronary artery without angina pectoris: Secondary | ICD-10-CM | POA: Diagnosis not present

## 2018-04-07 DIAGNOSIS — I10 Essential (primary) hypertension: Secondary | ICD-10-CM | POA: Diagnosis not present

## 2018-04-07 DIAGNOSIS — F419 Anxiety disorder, unspecified: Secondary | ICD-10-CM | POA: Diagnosis not present

## 2018-04-07 DIAGNOSIS — Z9981 Dependence on supplemental oxygen: Secondary | ICD-10-CM | POA: Diagnosis not present

## 2018-04-07 DIAGNOSIS — J84112 Idiopathic pulmonary fibrosis: Secondary | ICD-10-CM | POA: Diagnosis not present

## 2018-04-07 DIAGNOSIS — H409 Unspecified glaucoma: Secondary | ICD-10-CM | POA: Diagnosis not present

## 2018-04-08 DIAGNOSIS — J84112 Idiopathic pulmonary fibrosis: Secondary | ICD-10-CM | POA: Diagnosis not present

## 2018-04-08 DIAGNOSIS — Z9981 Dependence on supplemental oxygen: Secondary | ICD-10-CM | POA: Diagnosis not present

## 2018-04-08 DIAGNOSIS — I2510 Atherosclerotic heart disease of native coronary artery without angina pectoris: Secondary | ICD-10-CM | POA: Diagnosis not present

## 2018-04-08 DIAGNOSIS — F419 Anxiety disorder, unspecified: Secondary | ICD-10-CM | POA: Diagnosis not present

## 2018-04-08 DIAGNOSIS — I10 Essential (primary) hypertension: Secondary | ICD-10-CM | POA: Diagnosis not present

## 2018-04-08 DIAGNOSIS — H409 Unspecified glaucoma: Secondary | ICD-10-CM | POA: Diagnosis not present

## 2018-04-09 DIAGNOSIS — F419 Anxiety disorder, unspecified: Secondary | ICD-10-CM | POA: Diagnosis not present

## 2018-04-09 DIAGNOSIS — H409 Unspecified glaucoma: Secondary | ICD-10-CM | POA: Diagnosis not present

## 2018-04-09 DIAGNOSIS — I10 Essential (primary) hypertension: Secondary | ICD-10-CM | POA: Diagnosis not present

## 2018-04-09 DIAGNOSIS — Z9981 Dependence on supplemental oxygen: Secondary | ICD-10-CM | POA: Diagnosis not present

## 2018-04-09 DIAGNOSIS — I2510 Atherosclerotic heart disease of native coronary artery without angina pectoris: Secondary | ICD-10-CM | POA: Diagnosis not present

## 2018-04-09 DIAGNOSIS — J84112 Idiopathic pulmonary fibrosis: Secondary | ICD-10-CM | POA: Diagnosis not present

## 2018-04-11 DIAGNOSIS — F419 Anxiety disorder, unspecified: Secondary | ICD-10-CM | POA: Diagnosis not present

## 2018-04-11 DIAGNOSIS — I2510 Atherosclerotic heart disease of native coronary artery without angina pectoris: Secondary | ICD-10-CM | POA: Diagnosis not present

## 2018-04-11 DIAGNOSIS — J84112 Idiopathic pulmonary fibrosis: Secondary | ICD-10-CM | POA: Diagnosis not present

## 2018-04-11 DIAGNOSIS — I10 Essential (primary) hypertension: Secondary | ICD-10-CM | POA: Diagnosis not present

## 2018-04-11 DIAGNOSIS — Z9981 Dependence on supplemental oxygen: Secondary | ICD-10-CM | POA: Diagnosis not present

## 2018-04-11 DIAGNOSIS — H409 Unspecified glaucoma: Secondary | ICD-10-CM | POA: Diagnosis not present

## 2018-04-16 ENCOUNTER — Ambulatory Visit: Payer: Medicare Other | Admitting: Pulmonary Disease

## 2018-04-16 NOTE — Progress Notes (Deleted)
Subjective:   PATIENT ID: Casey [name] GENDER: male DOB: 1929-02-23, MRN: [id number]  Synopsis: Referred in 2017 originally to Dr. Ashok Cordia for IPF.  He never smoked.  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  He has been on Ofev since 2017.   HPI  No chief complaint on file.  ***  Past Medical History:  Diagnosis Date  . Carotid artery stenosis   . Chronic respiratory failure ([place]) 06/01/2016   05/2017 ONO RA < 88% more than 83% of the night (7 hours)  . Edema 09/22/2015  . Essential hypertension 09/22/2015  . GERD (gastroesophageal reflux disease)   . Glaucoma   . Hyperlipemia   . Hypertension   . ILD (interstitial lung disease) ([place]) 08/19/2015  . IPF (idiopathic pulmonary fibrosis) ([place]) 04/23/2016  . Mediastinal lymphadenopathy 09/22/2015  . Post-traumatic osteoarthritis, right ankle and [name] 09/18/2011       Review of Systems  Constitutional: Negative for chills, diaphoresis and malaise/fatigue.  HENT: Negative for congestion.   Respiratory: Positive for shortness of breath. Negative for cough, sputum production and wheezing.   Cardiovascular: Negative for chest pain, orthopnea and leg swelling.      Objective:  Physical Exam   There were no vitals filed for this visit. 10L [place] > O2 saturation 90%  ***   CBC    Component Value Date/Time   WBC 13.6 (H) 05/28/2017 1151   RBC 4.03 (L) 05/28/2017 1151   HGB 13.5 05/28/2017 1151   HCT 41.1 05/28/2017 1151   PLT 317.0 05/28/2017 1151   MCV 102.0 (H) 05/28/2017 1151   MCHC 32.8 05/28/2017 1151   RDW 15.1 05/28/2017 1151   LYMPHSABS 1.8 05/28/2017 1151   MONOABS 1.1 (H) 05/28/2017 1151   EOSABS 0.3 05/28/2017 1151   BASOSABS 0.1 05/28/2017 1151     PFT 04/10/17: FVC 2.72 L 81% predicted FEV1 2.30 L 01/07/17: FVC 2.72 L (81%) FEV1 2.31 L (100%) FEV1/FVC 0.85 FEF 25-75 2.60 L (180%) DLCO corrected 19% 09/24/16: FVC 3.02 L (89%) FEV1 2.56 L (110%) FEV1/FVC 0.85 FEF 25-75 3.81 L (260%) DLCO  corrected 20% 06/19/16: FVC 3.02 L (89%) FEV1 2.48 L (107%) FEV1/FVC 0.82 FEF 25-75 3.26 L (221%) DLCO corrected 27% 04/23/16: FVC 2.70 L (82%) FEV1 2.38 L (102%) FEV1/FVC 0.86 FEF 25-75 2.98 L (201%) DLCO corrected 31% (hgb 14.6)  6MWT 05/2017:  110m O2 saturation nadir 90% 01/07/17:  Walked 198 meters / Baseline Sat 97% on 3 L/m pulse / Nadir Sat 88% on 3 L/m pulse @ 3:13 (required 4 L/m pulse with exertion) 11/12/16:  Walked 240 meters / Baseline Sat 97% on 4 L/m / Nadir Sat 92% on 4 L/m @ end of test (HR increased to 140bpm during test) 09/24/16:  Walked 264 meters / Baseline Sat 96% on RA / Nadir Sat 85% on RA @ 4:08 (required 3 L/m to maintain with exertion) 06/19/16:  Walked 336 meters / Baseline Sat 93% on RA / Nadir Sat 83% on RA @ end of test (required 2 L/m to maintain with repeat walk) 04/23/16:  Walked 354 meters / Baseline Sat 96% on RA / Nadir Sat 83% on RA @ end of test (required 3 L/m to maintain with repeat walk) 10/21/15:  Walked 324 meters / Baseline Sat 95% on RA / Nadir Sat 88% on RA @ end of test  IMAGING July 2019 chest x-ray images independently reviewed showing pulmonary parenchymal fibrotic changes bilaterally, no pulmonary infiltrate  CT chest January 2019  showed honeycombing some traction bronchiectasis scattered interstitial change consistent with UIP, images independently  CT CHEST W/O 02/27/16:  Bilateral intralobular septal thickening and fibrotic changes with traction bronchiectasis relatively unchanged when compared with previous CT imaging. Mediastinal adenopathy persists with largest lymph node is subcarinal measuring approximately 1.6 cm in short axis. No pleural effusion or thickening. No pericardial effusion. No developing nodule or mass.  HRCT CHEST W/O 09/27/15:  No pericardial effusion. No pleural effusion or thickening. Traction bronchiectasis with subpleural and primary bronchovascular reticular changes as well as groundglass. Involvement of both apical  and basilar regions. Early honeycomb changes. Small to moderate hiatal hernia. Calcifications noted within the liver and spleen. No axillary adenopathy. Enlarged mediastinal lymph nodes measuring up to 1.8 cm in short axis present.  CT CHEST/ABD/PELVIS W/ 08/19/15: Mediastinal lymphadenopathy noted with the largest lymph node measuring 1.6 cm at level 7, 1.3 cm 4L, & 1.1 cm 4R positions. Calcifications noted within the spleen and liver suggestive of prior granulomatous disease. No pleural effusion or thickening. Patchy bilateral intralobular septal thickening and reticulation consistent with interstitial lung disease. This appears both centrally in the lungs as well as peripherally occurring throughout all fields with slightly less involvement in the upper lung zones. Suggestion of traction bronchiectasis. No obvious groundglass opacities. There is also suggestion of honeycomb changes. Remote T-8 compression fracture with advanced anterior height loss. Mild finding enlargement of left inguinal canal. Renal cysts on the left noted with mild renal atrophy and no hydronephrosis. Enlarged central prostate that is symmetric. Colonic diverticulosis noted. No appendicitis. No mass or adenopathy. No ascites or pneumoperitoneum.  PORT CXR 02/01/09: Heart appears to be largely normal in size and unchanged when compared with previous portable chest x-ray from 2009. Mediastinum normal in contour. No obvious parenchymal opacities or increased interstitial markings. No pleural effusion.   Echocardiogram: January 2019 LVEF "may be moderately depressed, Doppler parameters consistent with grade 1 diastolic dysfunction, right ventricle moderately dilated, systolic function moderately reduced, right atrium mildly dilated  Overnight oxygen test: February 2019 time less than 88% 2.3 minutes on 2 L nasal cannula  LABS 10/17/16 CBC: 10.7/13.9/41.8/236 BMP: 135/4.3/104/22/19/1.19/83/8.5 LFT:  3.4/6.3/0.6/61/19/18  10/21/15 Anti-CCP:  <16 RF:  <10 Chromatin antibody:  <0.2 [employer] antibody screen:  <1.0 Double-stranded DNA antibody: 1 ENA Smith antibody:  <1 SSA:   <1.0 SSB:   <1.0 SCL 70:   <1.0  09/22/15 ProBNP: 97.0 CRP: 0.5 ESR: 16 ANA: Negative Hypersensitivity Pneumonitis Panel: Negative   08/15/15 CBC: 9.1/14.2/43.2/239 BMP: 1:30/3.9/105/24/20/1.43/98/9.0 LFT: 3.8/6.7/0.4/73/19/20 ESR: 11 CRP: 5.5     Assessment & Plan:   No diagnosis found.  Discussion: ***   Current Outpatient Medications:  .  aspirin EC 81 MG tablet, Take 1 tablet by mouth daily., Disp: , Rfl:  .  atorvastatin (LIPITOR) 20 MG tablet, Take 1 tablet by mouth daily., Disp: , Rfl:  .  Cholecalciferol (VITAMIN D3) 2000 units TABS, Take 1 tablet by mouth every other day. , Disp: , Rfl:  .  clonazePAM (KLONOPIN) 0.5 MG tablet, Take 1 mg in AM, 0.5 mg in PM, Disp: 90 tablet, Rfl: 5 .  Coenzyme Q10 (CO Q-10) 100 MG CAPS, Take by mouth daily., Disp: , Rfl:  .  Dorzolamide HCl-Timolol Mal (COSOPT OP), 3 (three) times daily. , Disp: , Rfl:  .  Enalapril-Hydrochlorothiazide 5-12.5 MG tablet, Take 1 tablet by mouth every morning., Disp: , Rfl:  .  latanoprost (XALATAN) 0.005 % ophthalmic solution, 2 (two) times daily., Disp: , Rfl:  .  Nintedanib (OFEV) 150 MG CAPS, Take 150 mg by mouth 2 (two) times daily., Disp: , Rfl:  .  OXYGEN, o2 4lpm with rest 8 with exertion [employer] , Disp: , Rfl:  .  pantoprazole (PROTONIX) 40 MG tablet, Take 1 tablet by mouth daily., Disp: , Rfl:  .  predniSONE (DELTASONE) 10 MG tablet, Take 1 tablet (10 mg total) by mouth daily with breakfast., Disp: 30 tablet, Rfl: 2 .  predniSONE (DELTASONE) 20 MG tablet, Take 20 mg daily for 2 weeks then take 10 mg for 2 weeks. (Patient not taking: Reported on 02/19/2018), Disp: 21 tablet, Rfl: 0

## 2018-04-19 ENCOUNTER — Telehealth: Payer: Self-pay | Admitting: Pulmonary Disease

## 2018-04-19 NOTE — Telephone Encounter (Signed)
Called by hospice nurse. He is actively dying, moving to hospice house

## 2018-04-21 NOTE — Telephone Encounter (Signed)
Noted, thanks for passing along

## 2018-05-14 DEATH — deceased

## 2020-03-24 IMAGING — DX DG CHEST 2V
2 series · 2 of 2 positions shown · non-contrast
Comparison: None.

CLINICAL DATA: Pulmonary fibrosis. Routine exam. Some increased
shortness of breath.

EXAM:
CHEST - 2 VIEW

[chest pa]
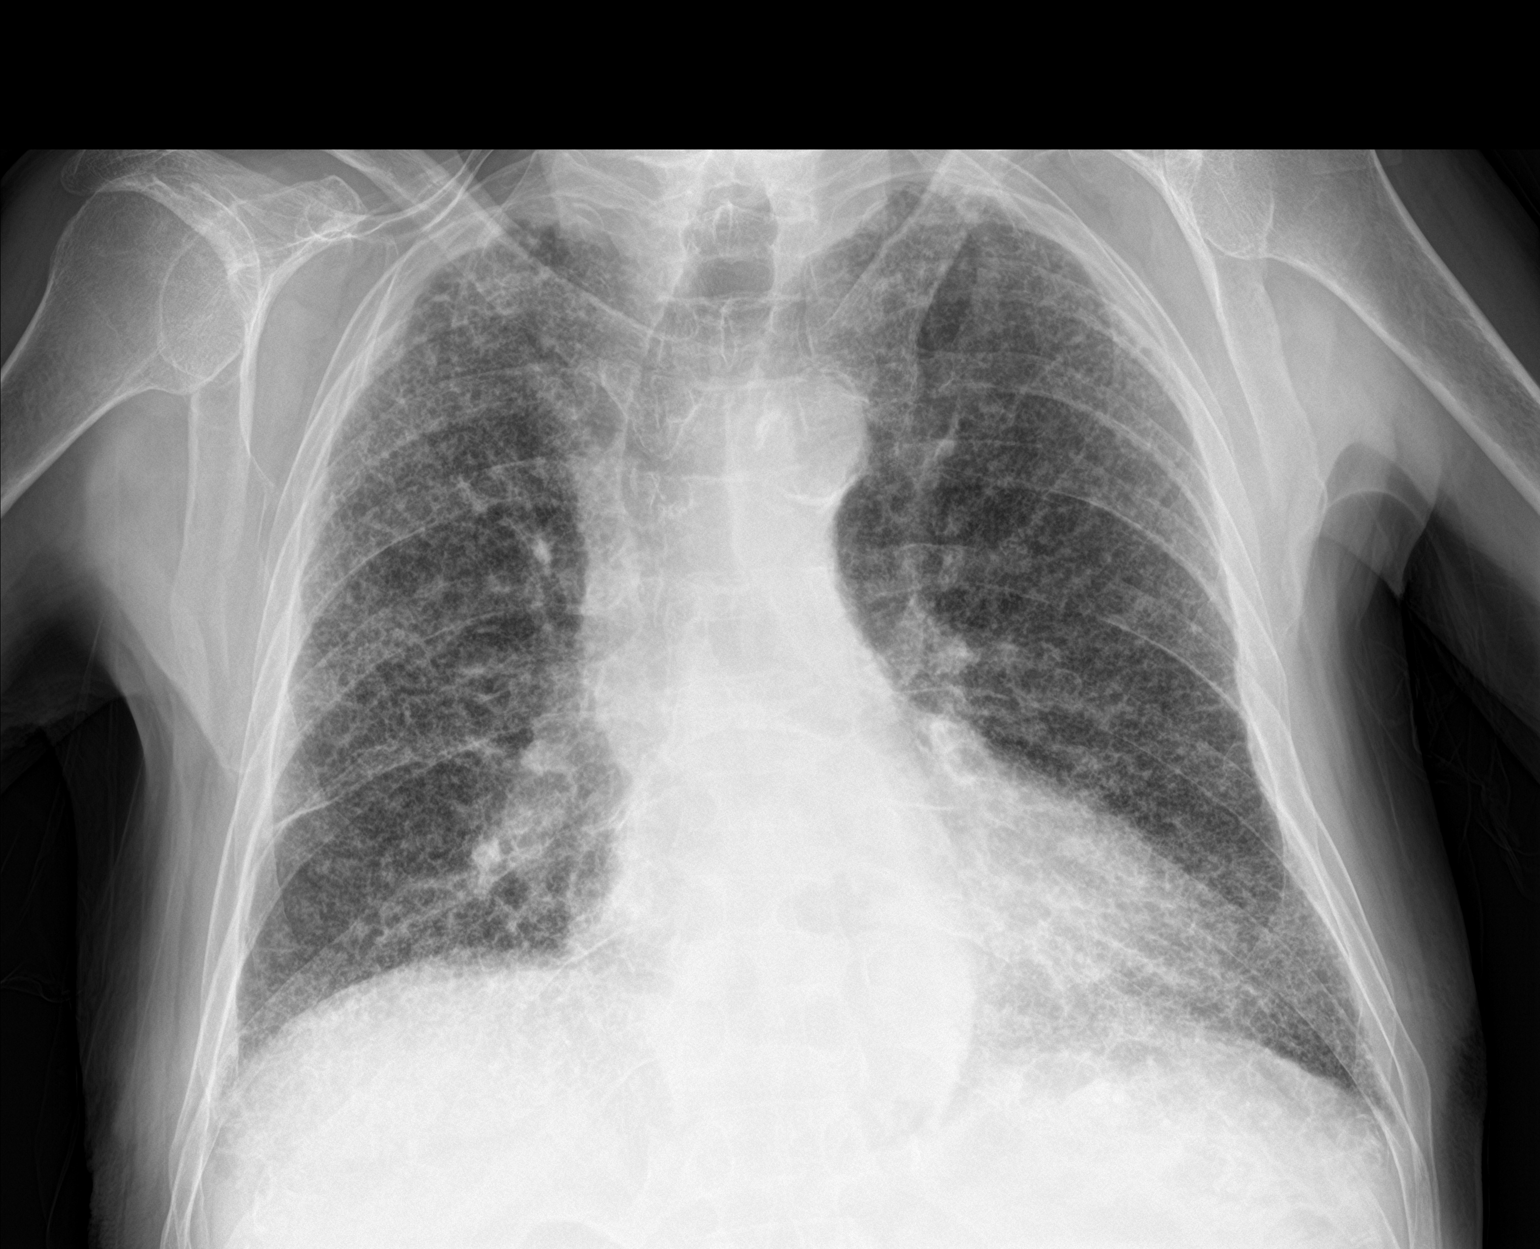

[chest lat]
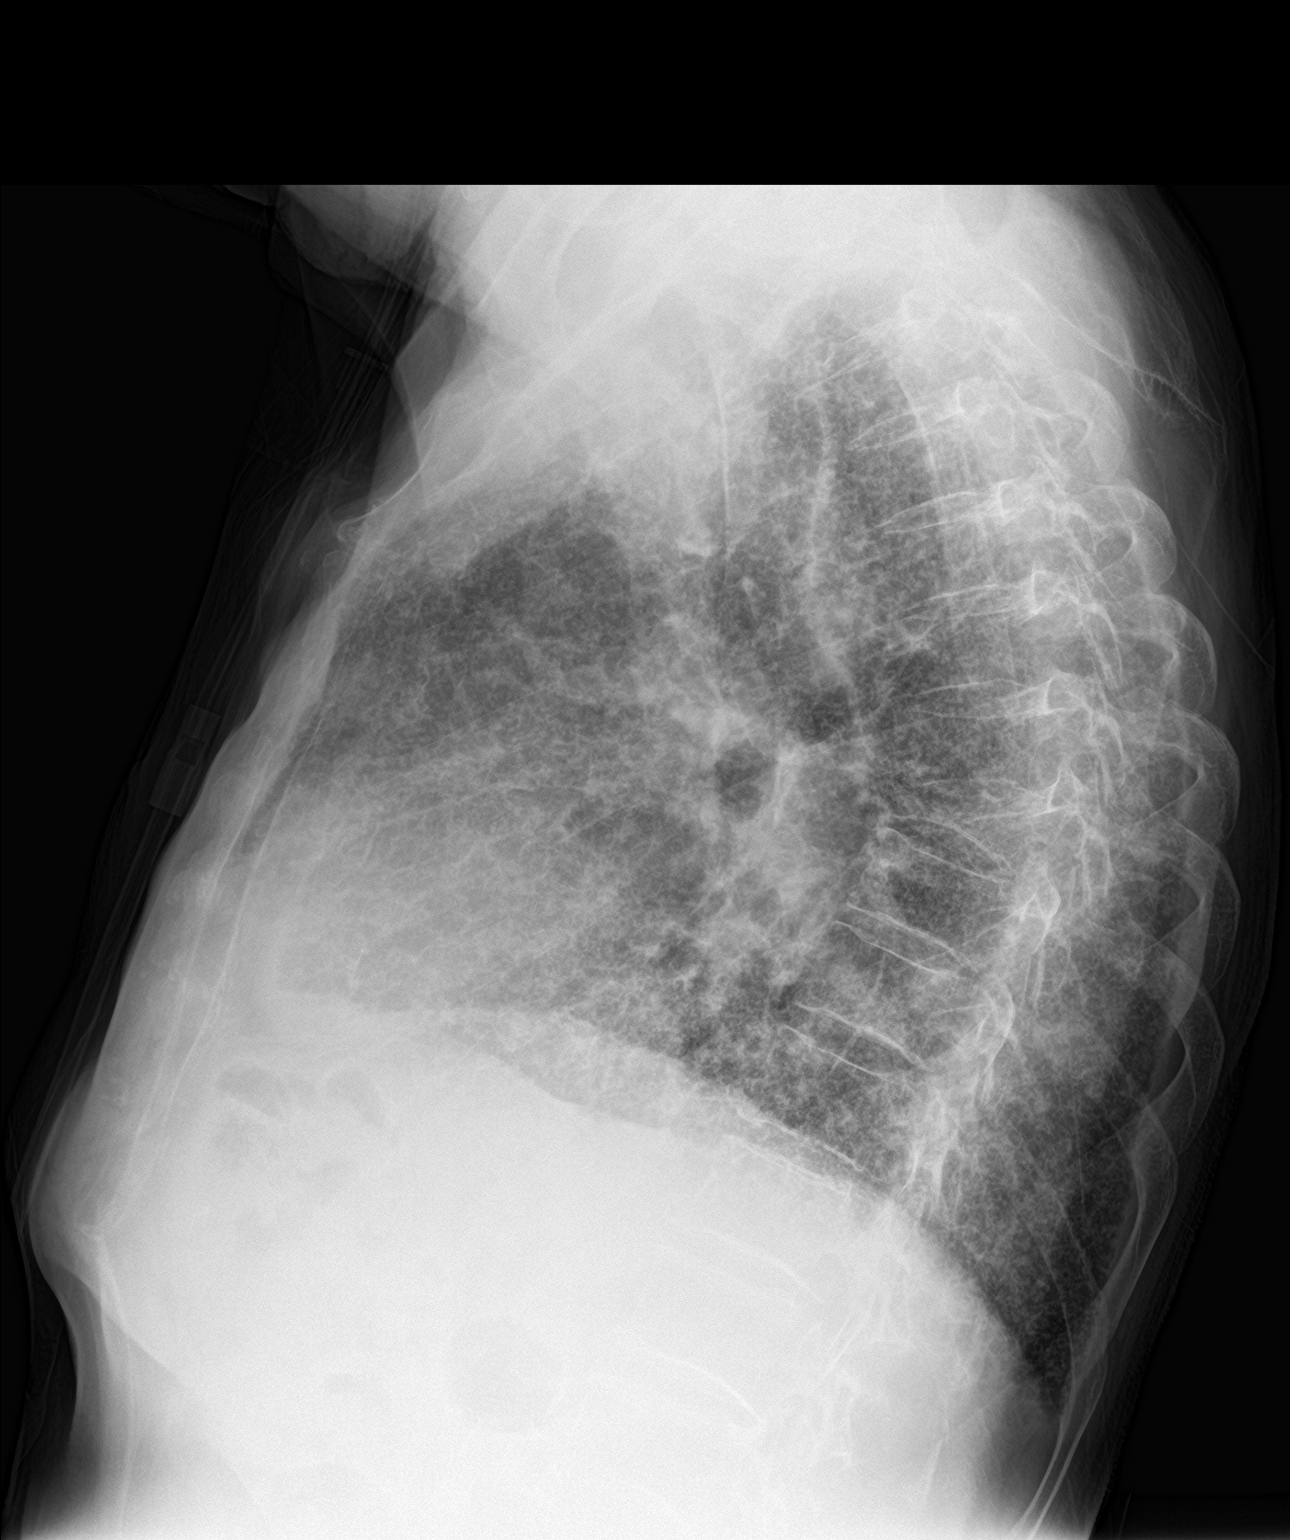

[2 of 2 positions shown; findings below may reference images not displayed]

FINDINGS: There is heterogeneous bilateral irregular interstitial thickening
consistent with the diagnosis of interstitial pulmonary fibrosis.
There is no lung consolidation to suggest pneumonia. No evidence of
pulmonary edema.

Cardiac silhouette is normal in size. No mediastinal or hilar
masses. No convincing adenopathy.

Small hiatal hernia.

Moderate compression fracture of a midthoracic vertebra of unclear
chronicity, but likely old.
IMPRESSION: 1. Findings consistent with the given diagnosis of interstitial
pulmonary fibrosis. No current prior studies are available
evaluation of stability.
2. No acute findings.
3. Impression fracture of a midthoracic vertebra, presumed old in
the absence of reported back pain.
